# Patient Record
Sex: Male | Born: 1935 | Hispanic: No | Smoking: Former smoker
Health system: Southern US, Community
[De-identification: ages and names within clinical notes are randomized; demographics above are authoritative.]

## PROBLEM LIST (undated history)

## (undated) DIAGNOSIS — J849 Interstitial pulmonary disease, unspecified: Secondary | ICD-10-CM

## (undated) DIAGNOSIS — E079 Disorder of thyroid, unspecified: Secondary | ICD-10-CM

## (undated) DIAGNOSIS — J90 Pleural effusion, not elsewhere classified: Secondary | ICD-10-CM

## (undated) DIAGNOSIS — F0390 Unspecified dementia without behavioral disturbance: Secondary | ICD-10-CM

## (undated) DIAGNOSIS — I5021 Acute systolic (congestive) heart failure: Secondary | ICD-10-CM

## (undated) DIAGNOSIS — G20A1 Parkinson's disease without dyskinesia, without mention of fluctuations: Secondary | ICD-10-CM

## (undated) DIAGNOSIS — R0902 Hypoxemia: Secondary | ICD-10-CM

## (undated) DIAGNOSIS — E039 Hypothyroidism, unspecified: Secondary | ICD-10-CM

## (undated) DIAGNOSIS — J189 Pneumonia, unspecified organism: Secondary | ICD-10-CM

## (undated) HISTORY — DX: Disorder of thyroid, unspecified: E07.9

## (undated) HISTORY — DX: Hypoxemia: R09.02

## (undated) HISTORY — DX: Unspecified dementia, unspecified severity, without behavioral disturbance, psychotic disturbance, mood disturbance, and anxiety: F03.90

## (undated) HISTORY — DX: Pleural effusion, not elsewhere classified: J90

## (undated) HISTORY — DX: Interstitial pulmonary disease, unspecified: J84.9

## (undated) HISTORY — DX: Parkinson's disease without dyskinesia, without mention of fluctuations: G20.A1

## (undated) HISTORY — DX: Pneumonia, unspecified organism: J18.9

## (undated) HISTORY — DX: Acute systolic (congestive) heart failure: I50.21

## (undated) HISTORY — DX: Hypothyroidism, unspecified: E03.9

---

## 2015-03-27 DIAGNOSIS — L110 Acquired keratosis follicularis: Secondary | ICD-10-CM | POA: Diagnosis not present

## 2015-03-27 DIAGNOSIS — I739 Peripheral vascular disease, unspecified: Secondary | ICD-10-CM | POA: Diagnosis not present

## 2015-03-27 DIAGNOSIS — L609 Nail disorder, unspecified: Secondary | ICD-10-CM | POA: Diagnosis not present

## 2015-05-07 DIAGNOSIS — E7800 Pure hypercholesterolemia, unspecified: Secondary | ICD-10-CM | POA: Diagnosis not present

## 2015-05-07 DIAGNOSIS — E039 Hypothyroidism, unspecified: Secondary | ICD-10-CM | POA: Diagnosis not present

## 2015-05-07 DIAGNOSIS — F0390 Unspecified dementia without behavioral disturbance: Secondary | ICD-10-CM | POA: Diagnosis not present

## 2015-05-14 DIAGNOSIS — R351 Nocturia: Secondary | ICD-10-CM | POA: Diagnosis not present

## 2015-05-14 DIAGNOSIS — R3912 Poor urinary stream: Secondary | ICD-10-CM | POA: Diagnosis not present

## 2015-05-14 DIAGNOSIS — R3913 Splitting of urinary stream: Secondary | ICD-10-CM | POA: Diagnosis not present

## 2015-05-14 DIAGNOSIS — R350 Frequency of micturition: Secondary | ICD-10-CM | POA: Diagnosis not present

## 2015-05-14 DIAGNOSIS — R3915 Urgency of urination: Secondary | ICD-10-CM | POA: Diagnosis not present

## 2015-05-14 DIAGNOSIS — R972 Elevated prostate specific antigen [PSA]: Secondary | ICD-10-CM | POA: Diagnosis not present

## 2015-06-19 DIAGNOSIS — I739 Peripheral vascular disease, unspecified: Secondary | ICD-10-CM | POA: Diagnosis not present

## 2015-06-19 DIAGNOSIS — M79672 Pain in left foot: Secondary | ICD-10-CM | POA: Diagnosis not present

## 2015-06-19 DIAGNOSIS — B351 Tinea unguium: Secondary | ICD-10-CM | POA: Diagnosis not present

## 2015-06-19 DIAGNOSIS — M79671 Pain in right foot: Secondary | ICD-10-CM | POA: Diagnosis not present

## 2015-07-01 DIAGNOSIS — E7800 Pure hypercholesterolemia, unspecified: Secondary | ICD-10-CM | POA: Diagnosis not present

## 2015-07-01 DIAGNOSIS — G309 Alzheimer's disease, unspecified: Secondary | ICD-10-CM | POA: Diagnosis not present

## 2015-07-19 DIAGNOSIS — G309 Alzheimer's disease, unspecified: Secondary | ICD-10-CM | POA: Diagnosis not present

## 2015-07-19 DIAGNOSIS — E7800 Pure hypercholesterolemia, unspecified: Secondary | ICD-10-CM | POA: Diagnosis not present

## 2015-08-28 DIAGNOSIS — I739 Peripheral vascular disease, unspecified: Secondary | ICD-10-CM | POA: Diagnosis not present

## 2015-08-28 DIAGNOSIS — L110 Acquired keratosis follicularis: Secondary | ICD-10-CM | POA: Diagnosis not present

## 2015-08-28 DIAGNOSIS — B351 Tinea unguium: Secondary | ICD-10-CM | POA: Diagnosis not present

## 2015-09-27 DIAGNOSIS — H2513 Age-related nuclear cataract, bilateral: Secondary | ICD-10-CM | POA: Diagnosis not present

## 2015-11-12 DIAGNOSIS — H43811 Vitreous degeneration, right eye: Secondary | ICD-10-CM | POA: Diagnosis not present

## 2015-11-27 DIAGNOSIS — M79672 Pain in left foot: Secondary | ICD-10-CM | POA: Diagnosis not present

## 2015-11-27 DIAGNOSIS — I739 Peripheral vascular disease, unspecified: Secondary | ICD-10-CM | POA: Diagnosis not present

## 2015-11-27 DIAGNOSIS — M79671 Pain in right foot: Secondary | ICD-10-CM | POA: Diagnosis not present

## 2015-11-27 DIAGNOSIS — B351 Tinea unguium: Secondary | ICD-10-CM | POA: Diagnosis not present

## 2015-12-18 DIAGNOSIS — E7800 Pure hypercholesterolemia, unspecified: Secondary | ICD-10-CM | POA: Diagnosis not present

## 2015-12-18 DIAGNOSIS — G309 Alzheimer's disease, unspecified: Secondary | ICD-10-CM | POA: Diagnosis not present

## 2016-01-01 DIAGNOSIS — Z23 Encounter for immunization: Secondary | ICD-10-CM | POA: Diagnosis not present

## 2016-02-05 DIAGNOSIS — B351 Tinea unguium: Secondary | ICD-10-CM | POA: Diagnosis not present

## 2016-02-05 DIAGNOSIS — L110 Acquired keratosis follicularis: Secondary | ICD-10-CM | POA: Diagnosis not present

## 2016-02-05 DIAGNOSIS — I739 Peripheral vascular disease, unspecified: Secondary | ICD-10-CM | POA: Diagnosis not present

## 2016-02-12 DIAGNOSIS — H43811 Vitreous degeneration, right eye: Secondary | ICD-10-CM | POA: Diagnosis not present

## 2016-02-18 DIAGNOSIS — Z6820 Body mass index (BMI) 20.0-20.9, adult: Secondary | ICD-10-CM | POA: Diagnosis not present

## 2016-02-18 DIAGNOSIS — E7800 Pure hypercholesterolemia, unspecified: Secondary | ICD-10-CM | POA: Diagnosis not present

## 2016-02-18 DIAGNOSIS — Z1211 Encounter for screening for malignant neoplasm of colon: Secondary | ICD-10-CM | POA: Diagnosis not present

## 2016-02-18 DIAGNOSIS — Z7189 Other specified counseling: Secondary | ICD-10-CM | POA: Diagnosis not present

## 2016-02-18 DIAGNOSIS — Z0000 Encounter for general adult medical examination without abnormal findings: Secondary | ICD-10-CM | POA: Diagnosis not present

## 2016-02-18 DIAGNOSIS — E039 Hypothyroidism, unspecified: Secondary | ICD-10-CM | POA: Diagnosis not present

## 2016-02-18 DIAGNOSIS — Z125 Encounter for screening for malignant neoplasm of prostate: Secondary | ICD-10-CM | POA: Diagnosis not present

## 2016-02-18 DIAGNOSIS — Z299 Encounter for prophylactic measures, unspecified: Secondary | ICD-10-CM | POA: Diagnosis not present

## 2016-02-18 DIAGNOSIS — Z79899 Other long term (current) drug therapy: Secondary | ICD-10-CM | POA: Diagnosis not present

## 2016-02-18 DIAGNOSIS — Z1389 Encounter for screening for other disorder: Secondary | ICD-10-CM | POA: Diagnosis not present

## 2016-04-08 DIAGNOSIS — E7800 Pure hypercholesterolemia, unspecified: Secondary | ICD-10-CM | POA: Diagnosis not present

## 2016-04-08 DIAGNOSIS — G309 Alzheimer's disease, unspecified: Secondary | ICD-10-CM | POA: Diagnosis not present

## 2016-04-22 DIAGNOSIS — I739 Peripheral vascular disease, unspecified: Secondary | ICD-10-CM | POA: Diagnosis not present

## 2016-04-22 DIAGNOSIS — B351 Tinea unguium: Secondary | ICD-10-CM | POA: Diagnosis not present

## 2016-04-22 DIAGNOSIS — L110 Acquired keratosis follicularis: Secondary | ICD-10-CM | POA: Diagnosis not present

## 2016-05-02 DIAGNOSIS — R4182 Altered mental status, unspecified: Secondary | ICD-10-CM | POA: Diagnosis not present

## 2016-05-02 DIAGNOSIS — R5381 Other malaise: Secondary | ICD-10-CM | POA: Diagnosis not present

## 2016-05-02 DIAGNOSIS — R404 Transient alteration of awareness: Secondary | ICD-10-CM | POA: Diagnosis not present

## 2016-05-02 DIAGNOSIS — R402411 Glasgow coma scale score 13-15, in the field [EMT or ambulance]: Secondary | ICD-10-CM | POA: Diagnosis not present

## 2016-05-19 DIAGNOSIS — R350 Frequency of micturition: Secondary | ICD-10-CM | POA: Diagnosis not present

## 2016-05-19 DIAGNOSIS — R3915 Urgency of urination: Secondary | ICD-10-CM | POA: Diagnosis not present

## 2016-05-19 DIAGNOSIS — R972 Elevated prostate specific antigen [PSA]: Secondary | ICD-10-CM | POA: Diagnosis not present

## 2016-05-19 DIAGNOSIS — R351 Nocturia: Secondary | ICD-10-CM | POA: Diagnosis not present

## 2016-05-19 DIAGNOSIS — N401 Enlarged prostate with lower urinary tract symptoms: Secondary | ICD-10-CM | POA: Diagnosis not present

## 2016-05-22 DIAGNOSIS — N401 Enlarged prostate with lower urinary tract symptoms: Secondary | ICD-10-CM | POA: Diagnosis not present

## 2016-07-01 DIAGNOSIS — L110 Acquired keratosis follicularis: Secondary | ICD-10-CM | POA: Diagnosis not present

## 2016-07-01 DIAGNOSIS — I739 Peripheral vascular disease, unspecified: Secondary | ICD-10-CM | POA: Diagnosis not present

## 2016-07-01 DIAGNOSIS — B351 Tinea unguium: Secondary | ICD-10-CM | POA: Diagnosis not present

## 2016-07-28 DIAGNOSIS — G309 Alzheimer's disease, unspecified: Secondary | ICD-10-CM | POA: Diagnosis not present

## 2016-07-28 DIAGNOSIS — E7800 Pure hypercholesterolemia, unspecified: Secondary | ICD-10-CM | POA: Diagnosis not present

## 2016-08-13 DIAGNOSIS — H2513 Age-related nuclear cataract, bilateral: Secondary | ICD-10-CM | POA: Diagnosis not present

## 2016-08-13 DIAGNOSIS — H5440 Blindness, one eye, unspecified eye: Secondary | ICD-10-CM | POA: Diagnosis not present

## 2016-08-13 DIAGNOSIS — H43811 Vitreous degeneration, right eye: Secondary | ICD-10-CM | POA: Diagnosis not present

## 2016-10-09 DIAGNOSIS — G309 Alzheimer's disease, unspecified: Secondary | ICD-10-CM | POA: Diagnosis not present

## 2016-10-09 DIAGNOSIS — E7800 Pure hypercholesterolemia, unspecified: Secondary | ICD-10-CM | POA: Diagnosis not present

## 2016-10-13 DIAGNOSIS — H43811 Vitreous degeneration, right eye: Secondary | ICD-10-CM | POA: Diagnosis not present

## 2016-10-13 DIAGNOSIS — H5440 Blindness, one eye, unspecified eye: Secondary | ICD-10-CM | POA: Diagnosis not present

## 2016-10-13 DIAGNOSIS — H31101 Choroidal degeneration, unspecified, right eye: Secondary | ICD-10-CM | POA: Diagnosis not present

## 2016-10-13 DIAGNOSIS — H2513 Age-related nuclear cataract, bilateral: Secondary | ICD-10-CM | POA: Diagnosis not present

## 2016-10-14 DIAGNOSIS — M79671 Pain in right foot: Secondary | ICD-10-CM | POA: Diagnosis not present

## 2016-10-14 DIAGNOSIS — I739 Peripheral vascular disease, unspecified: Secondary | ICD-10-CM | POA: Diagnosis not present

## 2016-10-14 DIAGNOSIS — B351 Tinea unguium: Secondary | ICD-10-CM | POA: Diagnosis not present

## 2016-10-14 DIAGNOSIS — L110 Acquired keratosis follicularis: Secondary | ICD-10-CM | POA: Diagnosis not present

## 2016-11-19 DIAGNOSIS — G309 Alzheimer's disease, unspecified: Secondary | ICD-10-CM | POA: Diagnosis not present

## 2016-11-19 DIAGNOSIS — E7800 Pure hypercholesterolemia, unspecified: Secondary | ICD-10-CM | POA: Diagnosis not present

## 2016-12-14 DIAGNOSIS — E7800 Pure hypercholesterolemia, unspecified: Secondary | ICD-10-CM | POA: Diagnosis not present

## 2016-12-14 DIAGNOSIS — G309 Alzheimer's disease, unspecified: Secondary | ICD-10-CM | POA: Diagnosis not present

## 2017-01-06 DIAGNOSIS — I739 Peripheral vascular disease, unspecified: Secondary | ICD-10-CM | POA: Diagnosis not present

## 2017-01-06 DIAGNOSIS — B351 Tinea unguium: Secondary | ICD-10-CM | POA: Diagnosis not present

## 2017-01-06 DIAGNOSIS — M79671 Pain in right foot: Secondary | ICD-10-CM | POA: Diagnosis not present

## 2017-01-06 DIAGNOSIS — L110 Acquired keratosis follicularis: Secondary | ICD-10-CM | POA: Diagnosis not present

## 2017-01-08 DIAGNOSIS — G309 Alzheimer's disease, unspecified: Secondary | ICD-10-CM | POA: Diagnosis not present

## 2017-01-08 DIAGNOSIS — E7800 Pure hypercholesterolemia, unspecified: Secondary | ICD-10-CM | POA: Diagnosis not present

## 2017-01-15 DIAGNOSIS — Z23 Encounter for immunization: Secondary | ICD-10-CM | POA: Diagnosis not present

## 2017-02-16 DIAGNOSIS — G309 Alzheimer's disease, unspecified: Secondary | ICD-10-CM | POA: Diagnosis not present

## 2017-02-16 DIAGNOSIS — E7800 Pure hypercholesterolemia, unspecified: Secondary | ICD-10-CM | POA: Diagnosis not present

## 2017-02-23 DIAGNOSIS — Z1211 Encounter for screening for malignant neoplasm of colon: Secondary | ICD-10-CM | POA: Diagnosis not present

## 2017-02-23 DIAGNOSIS — Z125 Encounter for screening for malignant neoplasm of prostate: Secondary | ICD-10-CM | POA: Diagnosis not present

## 2017-02-23 DIAGNOSIS — Z6820 Body mass index (BMI) 20.0-20.9, adult: Secondary | ICD-10-CM | POA: Diagnosis not present

## 2017-02-23 DIAGNOSIS — E7800 Pure hypercholesterolemia, unspecified: Secondary | ICD-10-CM | POA: Diagnosis not present

## 2017-02-23 DIAGNOSIS — Z299 Encounter for prophylactic measures, unspecified: Secondary | ICD-10-CM | POA: Diagnosis not present

## 2017-02-23 DIAGNOSIS — Z1331 Encounter for screening for depression: Secondary | ICD-10-CM | POA: Diagnosis not present

## 2017-02-23 DIAGNOSIS — Z79899 Other long term (current) drug therapy: Secondary | ICD-10-CM | POA: Diagnosis not present

## 2017-02-23 DIAGNOSIS — Z7189 Other specified counseling: Secondary | ICD-10-CM | POA: Diagnosis not present

## 2017-02-23 DIAGNOSIS — Z1339 Encounter for screening examination for other mental health and behavioral disorders: Secondary | ICD-10-CM | POA: Diagnosis not present

## 2017-02-23 DIAGNOSIS — Z87891 Personal history of nicotine dependence: Secondary | ICD-10-CM | POA: Diagnosis not present

## 2017-02-23 DIAGNOSIS — Z0000 Encounter for general adult medical examination without abnormal findings: Secondary | ICD-10-CM | POA: Diagnosis not present

## 2017-02-23 DIAGNOSIS — R5383 Other fatigue: Secondary | ICD-10-CM | POA: Diagnosis not present

## 2017-02-23 DIAGNOSIS — E039 Hypothyroidism, unspecified: Secondary | ICD-10-CM | POA: Diagnosis not present

## 2017-03-24 DIAGNOSIS — M79671 Pain in right foot: Secondary | ICD-10-CM | POA: Diagnosis not present

## 2017-03-24 DIAGNOSIS — I739 Peripheral vascular disease, unspecified: Secondary | ICD-10-CM | POA: Diagnosis not present

## 2017-03-24 DIAGNOSIS — B351 Tinea unguium: Secondary | ICD-10-CM | POA: Diagnosis not present

## 2017-03-24 DIAGNOSIS — L110 Acquired keratosis follicularis: Secondary | ICD-10-CM | POA: Diagnosis not present

## 2017-05-25 DIAGNOSIS — R351 Nocturia: Secondary | ICD-10-CM | POA: Diagnosis not present

## 2017-05-25 DIAGNOSIS — R972 Elevated prostate specific antigen [PSA]: Secondary | ICD-10-CM | POA: Diagnosis not present

## 2017-05-25 DIAGNOSIS — N401 Enlarged prostate with lower urinary tract symptoms: Secondary | ICD-10-CM | POA: Diagnosis not present

## 2017-05-25 DIAGNOSIS — R350 Frequency of micturition: Secondary | ICD-10-CM | POA: Diagnosis not present

## 2017-05-25 DIAGNOSIS — N281 Cyst of kidney, acquired: Secondary | ICD-10-CM | POA: Diagnosis not present

## 2017-05-28 DIAGNOSIS — G309 Alzheimer's disease, unspecified: Secondary | ICD-10-CM | POA: Diagnosis not present

## 2017-05-28 DIAGNOSIS — E7800 Pure hypercholesterolemia, unspecified: Secondary | ICD-10-CM | POA: Diagnosis not present

## 2017-06-09 DIAGNOSIS — L110 Acquired keratosis follicularis: Secondary | ICD-10-CM | POA: Diagnosis not present

## 2017-06-09 DIAGNOSIS — B351 Tinea unguium: Secondary | ICD-10-CM | POA: Diagnosis not present

## 2017-06-09 DIAGNOSIS — I739 Peripheral vascular disease, unspecified: Secondary | ICD-10-CM | POA: Diagnosis not present

## 2017-06-09 DIAGNOSIS — M79671 Pain in right foot: Secondary | ICD-10-CM | POA: Diagnosis not present

## 2017-06-16 DIAGNOSIS — E7800 Pure hypercholesterolemia, unspecified: Secondary | ICD-10-CM | POA: Diagnosis not present

## 2017-06-16 DIAGNOSIS — G309 Alzheimer's disease, unspecified: Secondary | ICD-10-CM | POA: Diagnosis not present

## 2017-07-20 DIAGNOSIS — G309 Alzheimer's disease, unspecified: Secondary | ICD-10-CM | POA: Diagnosis not present

## 2017-07-20 DIAGNOSIS — E7800 Pure hypercholesterolemia, unspecified: Secondary | ICD-10-CM | POA: Diagnosis not present

## 2017-08-16 DIAGNOSIS — E7800 Pure hypercholesterolemia, unspecified: Secondary | ICD-10-CM | POA: Diagnosis not present

## 2017-08-16 DIAGNOSIS — G309 Alzheimer's disease, unspecified: Secondary | ICD-10-CM | POA: Diagnosis not present

## 2017-09-15 DIAGNOSIS — B351 Tinea unguium: Secondary | ICD-10-CM | POA: Diagnosis not present

## 2017-09-15 DIAGNOSIS — L110 Acquired keratosis follicularis: Secondary | ICD-10-CM | POA: Diagnosis not present

## 2017-09-15 DIAGNOSIS — I739 Peripheral vascular disease, unspecified: Secondary | ICD-10-CM | POA: Diagnosis not present

## 2017-09-15 DIAGNOSIS — M79671 Pain in right foot: Secondary | ICD-10-CM | POA: Diagnosis not present

## 2017-10-06 DIAGNOSIS — G309 Alzheimer's disease, unspecified: Secondary | ICD-10-CM | POA: Diagnosis not present

## 2017-10-06 DIAGNOSIS — E7800 Pure hypercholesterolemia, unspecified: Secondary | ICD-10-CM | POA: Diagnosis not present

## 2017-11-02 DIAGNOSIS — E7800 Pure hypercholesterolemia, unspecified: Secondary | ICD-10-CM | POA: Diagnosis not present

## 2017-11-02 DIAGNOSIS — G309 Alzheimer's disease, unspecified: Secondary | ICD-10-CM | POA: Diagnosis not present

## 2017-11-22 DIAGNOSIS — E7800 Pure hypercholesterolemia, unspecified: Secondary | ICD-10-CM | POA: Diagnosis not present

## 2017-11-22 DIAGNOSIS — G309 Alzheimer's disease, unspecified: Secondary | ICD-10-CM | POA: Diagnosis not present

## 2017-12-08 DIAGNOSIS — M79671 Pain in right foot: Secondary | ICD-10-CM | POA: Diagnosis not present

## 2017-12-08 DIAGNOSIS — I739 Peripheral vascular disease, unspecified: Secondary | ICD-10-CM | POA: Diagnosis not present

## 2017-12-08 DIAGNOSIS — B351 Tinea unguium: Secondary | ICD-10-CM | POA: Diagnosis not present

## 2017-12-08 DIAGNOSIS — L110 Acquired keratosis follicularis: Secondary | ICD-10-CM | POA: Diagnosis not present

## 2018-02-01 DIAGNOSIS — E7800 Pure hypercholesterolemia, unspecified: Secondary | ICD-10-CM | POA: Diagnosis not present

## 2018-02-01 DIAGNOSIS — G309 Alzheimer's disease, unspecified: Secondary | ICD-10-CM | POA: Diagnosis not present

## 2018-02-16 DIAGNOSIS — M79672 Pain in left foot: Secondary | ICD-10-CM | POA: Diagnosis not present

## 2018-02-16 DIAGNOSIS — L110 Acquired keratosis follicularis: Secondary | ICD-10-CM | POA: Diagnosis not present

## 2018-02-16 DIAGNOSIS — B351 Tinea unguium: Secondary | ICD-10-CM | POA: Diagnosis not present

## 2018-02-16 DIAGNOSIS — I739 Peripheral vascular disease, unspecified: Secondary | ICD-10-CM | POA: Diagnosis not present

## 2018-02-16 DIAGNOSIS — M79671 Pain in right foot: Secondary | ICD-10-CM | POA: Diagnosis not present

## 2018-03-04 DIAGNOSIS — R5383 Other fatigue: Secondary | ICD-10-CM | POA: Diagnosis not present

## 2018-03-04 DIAGNOSIS — Z125 Encounter for screening for malignant neoplasm of prostate: Secondary | ICD-10-CM | POA: Diagnosis not present

## 2018-03-04 DIAGNOSIS — Z7189 Other specified counseling: Secondary | ICD-10-CM | POA: Diagnosis not present

## 2018-03-04 DIAGNOSIS — Z1331 Encounter for screening for depression: Secondary | ICD-10-CM | POA: Diagnosis not present

## 2018-03-04 DIAGNOSIS — Z1211 Encounter for screening for malignant neoplasm of colon: Secondary | ICD-10-CM | POA: Diagnosis not present

## 2018-03-04 DIAGNOSIS — E039 Hypothyroidism, unspecified: Secondary | ICD-10-CM | POA: Diagnosis not present

## 2018-03-04 DIAGNOSIS — Z79899 Other long term (current) drug therapy: Secondary | ICD-10-CM | POA: Diagnosis not present

## 2018-03-04 DIAGNOSIS — Z299 Encounter for prophylactic measures, unspecified: Secondary | ICD-10-CM | POA: Diagnosis not present

## 2018-03-04 DIAGNOSIS — E7800 Pure hypercholesterolemia, unspecified: Secondary | ICD-10-CM | POA: Diagnosis not present

## 2018-03-04 DIAGNOSIS — Z0000 Encounter for general adult medical examination without abnormal findings: Secondary | ICD-10-CM | POA: Diagnosis not present

## 2018-03-04 DIAGNOSIS — Z6824 Body mass index (BMI) 24.0-24.9, adult: Secondary | ICD-10-CM | POA: Diagnosis not present

## 2018-03-04 DIAGNOSIS — Z1339 Encounter for screening examination for other mental health and behavioral disorders: Secondary | ICD-10-CM | POA: Diagnosis not present

## 2018-03-04 DIAGNOSIS — G20 Parkinson's disease: Secondary | ICD-10-CM | POA: Diagnosis not present

## 2018-04-07 DIAGNOSIS — H2513 Age-related nuclear cataract, bilateral: Secondary | ICD-10-CM | POA: Diagnosis not present

## 2018-04-07 DIAGNOSIS — Z135 Encounter for screening for eye and ear disorders: Secondary | ICD-10-CM | POA: Diagnosis not present

## 2018-04-07 DIAGNOSIS — H5000 Unspecified esotropia: Secondary | ICD-10-CM | POA: Diagnosis not present

## 2018-04-07 DIAGNOSIS — H5203 Hypermetropia, bilateral: Secondary | ICD-10-CM | POA: Diagnosis not present

## 2018-04-08 DIAGNOSIS — R413 Other amnesia: Secondary | ICD-10-CM | POA: Diagnosis not present

## 2018-04-08 DIAGNOSIS — R972 Elevated prostate specific antigen [PSA]: Secondary | ICD-10-CM | POA: Diagnosis not present

## 2018-04-08 DIAGNOSIS — F0390 Unspecified dementia without behavioral disturbance: Secondary | ICD-10-CM | POA: Diagnosis not present

## 2018-04-08 DIAGNOSIS — Z299 Encounter for prophylactic measures, unspecified: Secondary | ICD-10-CM | POA: Diagnosis not present

## 2018-04-08 DIAGNOSIS — G20 Parkinson's disease: Secondary | ICD-10-CM | POA: Diagnosis not present

## 2018-04-08 DIAGNOSIS — Z87891 Personal history of nicotine dependence: Secondary | ICD-10-CM | POA: Diagnosis not present

## 2018-04-08 DIAGNOSIS — Z6820 Body mass index (BMI) 20.0-20.9, adult: Secondary | ICD-10-CM | POA: Diagnosis not present

## 2018-04-12 DIAGNOSIS — G309 Alzheimer's disease, unspecified: Secondary | ICD-10-CM | POA: Diagnosis not present

## 2018-04-12 DIAGNOSIS — E7800 Pure hypercholesterolemia, unspecified: Secondary | ICD-10-CM | POA: Diagnosis not present

## 2018-05-02 DIAGNOSIS — H2511 Age-related nuclear cataract, right eye: Secondary | ICD-10-CM | POA: Diagnosis not present

## 2018-05-02 DIAGNOSIS — H2513 Age-related nuclear cataract, bilateral: Secondary | ICD-10-CM | POA: Diagnosis not present

## 2018-05-04 DIAGNOSIS — M79672 Pain in left foot: Secondary | ICD-10-CM | POA: Diagnosis not present

## 2018-05-04 DIAGNOSIS — L110 Acquired keratosis follicularis: Secondary | ICD-10-CM | POA: Diagnosis not present

## 2018-05-04 DIAGNOSIS — B351 Tinea unguium: Secondary | ICD-10-CM | POA: Diagnosis not present

## 2018-05-04 DIAGNOSIS — I739 Peripheral vascular disease, unspecified: Secondary | ICD-10-CM | POA: Diagnosis not present

## 2018-05-04 DIAGNOSIS — M79671 Pain in right foot: Secondary | ICD-10-CM | POA: Diagnosis not present

## 2018-05-24 DIAGNOSIS — H2511 Age-related nuclear cataract, right eye: Secondary | ICD-10-CM | POA: Diagnosis not present

## 2018-06-15 DIAGNOSIS — E7800 Pure hypercholesterolemia, unspecified: Secondary | ICD-10-CM | POA: Diagnosis not present

## 2018-06-15 DIAGNOSIS — G309 Alzheimer's disease, unspecified: Secondary | ICD-10-CM | POA: Diagnosis not present

## 2018-07-07 DIAGNOSIS — R972 Elevated prostate specific antigen [PSA]: Secondary | ICD-10-CM | POA: Diagnosis not present

## 2018-07-07 DIAGNOSIS — N401 Enlarged prostate with lower urinary tract symptoms: Secondary | ICD-10-CM | POA: Diagnosis not present

## 2018-07-07 DIAGNOSIS — R351 Nocturia: Secondary | ICD-10-CM | POA: Diagnosis not present

## 2018-07-07 DIAGNOSIS — R350 Frequency of micturition: Secondary | ICD-10-CM | POA: Diagnosis not present

## 2018-07-11 DIAGNOSIS — H2512 Age-related nuclear cataract, left eye: Secondary | ICD-10-CM | POA: Diagnosis not present

## 2018-07-11 DIAGNOSIS — H2589 Other age-related cataract: Secondary | ICD-10-CM | POA: Diagnosis not present

## 2018-07-14 DIAGNOSIS — E039 Hypothyroidism, unspecified: Secondary | ICD-10-CM | POA: Diagnosis not present

## 2018-07-14 DIAGNOSIS — Z299 Encounter for prophylactic measures, unspecified: Secondary | ICD-10-CM | POA: Diagnosis not present

## 2018-07-14 DIAGNOSIS — G20 Parkinson's disease: Secondary | ICD-10-CM | POA: Diagnosis not present

## 2018-07-14 DIAGNOSIS — F0390 Unspecified dementia without behavioral disturbance: Secondary | ICD-10-CM | POA: Diagnosis not present

## 2018-07-14 DIAGNOSIS — Z6820 Body mass index (BMI) 20.0-20.9, adult: Secondary | ICD-10-CM | POA: Diagnosis not present

## 2018-07-27 DIAGNOSIS — B351 Tinea unguium: Secondary | ICD-10-CM | POA: Diagnosis not present

## 2018-07-27 DIAGNOSIS — M79672 Pain in left foot: Secondary | ICD-10-CM | POA: Diagnosis not present

## 2018-07-27 DIAGNOSIS — I739 Peripheral vascular disease, unspecified: Secondary | ICD-10-CM | POA: Diagnosis not present

## 2018-07-27 DIAGNOSIS — M79671 Pain in right foot: Secondary | ICD-10-CM | POA: Diagnosis not present

## 2018-07-27 DIAGNOSIS — L110 Acquired keratosis follicularis: Secondary | ICD-10-CM | POA: Diagnosis not present

## 2018-08-02 DIAGNOSIS — E7800 Pure hypercholesterolemia, unspecified: Secondary | ICD-10-CM | POA: Diagnosis not present

## 2018-08-02 DIAGNOSIS — G309 Alzheimer's disease, unspecified: Secondary | ICD-10-CM | POA: Diagnosis not present

## 2018-08-30 DIAGNOSIS — E7800 Pure hypercholesterolemia, unspecified: Secondary | ICD-10-CM | POA: Diagnosis not present

## 2018-08-30 DIAGNOSIS — G309 Alzheimer's disease, unspecified: Secondary | ICD-10-CM | POA: Diagnosis not present

## 2018-10-19 DIAGNOSIS — I739 Peripheral vascular disease, unspecified: Secondary | ICD-10-CM | POA: Diagnosis not present

## 2018-10-19 DIAGNOSIS — M79672 Pain in left foot: Secondary | ICD-10-CM | POA: Diagnosis not present

## 2018-10-19 DIAGNOSIS — M79671 Pain in right foot: Secondary | ICD-10-CM | POA: Diagnosis not present

## 2018-10-19 DIAGNOSIS — B351 Tinea unguium: Secondary | ICD-10-CM | POA: Diagnosis not present

## 2018-10-19 DIAGNOSIS — L110 Acquired keratosis follicularis: Secondary | ICD-10-CM | POA: Diagnosis not present

## 2018-11-15 DIAGNOSIS — F0390 Unspecified dementia without behavioral disturbance: Secondary | ICD-10-CM | POA: Diagnosis not present

## 2018-11-15 DIAGNOSIS — G20 Parkinson's disease: Secondary | ICD-10-CM | POA: Diagnosis not present

## 2018-11-15 DIAGNOSIS — Z299 Encounter for prophylactic measures, unspecified: Secondary | ICD-10-CM | POA: Diagnosis not present

## 2018-11-15 DIAGNOSIS — Z6820 Body mass index (BMI) 20.0-20.9, adult: Secondary | ICD-10-CM | POA: Diagnosis not present

## 2018-11-15 DIAGNOSIS — E039 Hypothyroidism, unspecified: Secondary | ICD-10-CM | POA: Diagnosis not present

## 2018-11-23 DIAGNOSIS — G309 Alzheimer's disease, unspecified: Secondary | ICD-10-CM | POA: Diagnosis not present

## 2018-11-23 DIAGNOSIS — E7800 Pure hypercholesterolemia, unspecified: Secondary | ICD-10-CM | POA: Diagnosis not present

## 2018-12-23 DIAGNOSIS — Z299 Encounter for prophylactic measures, unspecified: Secondary | ICD-10-CM | POA: Diagnosis not present

## 2018-12-23 DIAGNOSIS — Z23 Encounter for immunization: Secondary | ICD-10-CM | POA: Diagnosis not present

## 2018-12-28 DIAGNOSIS — M79671 Pain in right foot: Secondary | ICD-10-CM | POA: Diagnosis not present

## 2018-12-28 DIAGNOSIS — L110 Acquired keratosis follicularis: Secondary | ICD-10-CM | POA: Diagnosis not present

## 2018-12-28 DIAGNOSIS — I739 Peripheral vascular disease, unspecified: Secondary | ICD-10-CM | POA: Diagnosis not present

## 2018-12-28 DIAGNOSIS — M79672 Pain in left foot: Secondary | ICD-10-CM | POA: Diagnosis not present

## 2018-12-28 DIAGNOSIS — B351 Tinea unguium: Secondary | ICD-10-CM | POA: Diagnosis not present

## 2019-02-16 DIAGNOSIS — Z79899 Other long term (current) drug therapy: Secondary | ICD-10-CM | POA: Diagnosis not present

## 2019-02-16 DIAGNOSIS — E7800 Pure hypercholesterolemia, unspecified: Secondary | ICD-10-CM | POA: Diagnosis not present

## 2019-02-16 DIAGNOSIS — G20 Parkinson's disease: Secondary | ICD-10-CM | POA: Diagnosis not present

## 2019-02-16 DIAGNOSIS — Z1339 Encounter for screening examination for other mental health and behavioral disorders: Secondary | ICD-10-CM | POA: Diagnosis not present

## 2019-02-16 DIAGNOSIS — Z125 Encounter for screening for malignant neoplasm of prostate: Secondary | ICD-10-CM | POA: Diagnosis not present

## 2019-02-16 DIAGNOSIS — Z6820 Body mass index (BMI) 20.0-20.9, adult: Secondary | ICD-10-CM | POA: Diagnosis not present

## 2019-02-16 DIAGNOSIS — R5383 Other fatigue: Secondary | ICD-10-CM | POA: Diagnosis not present

## 2019-02-16 DIAGNOSIS — E039 Hypothyroidism, unspecified: Secondary | ICD-10-CM | POA: Diagnosis not present

## 2019-02-16 DIAGNOSIS — Z1331 Encounter for screening for depression: Secondary | ICD-10-CM | POA: Diagnosis not present

## 2019-02-16 DIAGNOSIS — F0390 Unspecified dementia without behavioral disturbance: Secondary | ICD-10-CM | POA: Diagnosis not present

## 2019-02-16 DIAGNOSIS — Z7189 Other specified counseling: Secondary | ICD-10-CM | POA: Diagnosis not present

## 2019-02-16 DIAGNOSIS — Z1211 Encounter for screening for malignant neoplasm of colon: Secondary | ICD-10-CM | POA: Diagnosis not present

## 2019-02-16 DIAGNOSIS — Z299 Encounter for prophylactic measures, unspecified: Secondary | ICD-10-CM | POA: Diagnosis not present

## 2019-02-16 DIAGNOSIS — Z0000 Encounter for general adult medical examination without abnormal findings: Secondary | ICD-10-CM | POA: Diagnosis not present

## 2019-02-27 DIAGNOSIS — Z03818 Encounter for observation for suspected exposure to other biological agents ruled out: Secondary | ICD-10-CM | POA: Diagnosis not present

## 2019-03-16 DIAGNOSIS — G309 Alzheimer's disease, unspecified: Secondary | ICD-10-CM | POA: Diagnosis not present

## 2019-03-16 DIAGNOSIS — E7800 Pure hypercholesterolemia, unspecified: Secondary | ICD-10-CM | POA: Diagnosis not present

## 2019-03-21 DIAGNOSIS — Z6820 Body mass index (BMI) 20.0-20.9, adult: Secondary | ICD-10-CM | POA: Diagnosis not present

## 2019-03-21 DIAGNOSIS — R32 Unspecified urinary incontinence: Secondary | ICD-10-CM | POA: Diagnosis not present

## 2019-03-21 DIAGNOSIS — G20 Parkinson's disease: Secondary | ICD-10-CM | POA: Diagnosis not present

## 2019-03-21 DIAGNOSIS — U071 COVID-19: Secondary | ICD-10-CM | POA: Diagnosis not present

## 2019-03-21 DIAGNOSIS — Z299 Encounter for prophylactic measures, unspecified: Secondary | ICD-10-CM | POA: Diagnosis not present

## 2019-03-21 DIAGNOSIS — F0390 Unspecified dementia without behavioral disturbance: Secondary | ICD-10-CM | POA: Diagnosis not present

## 2019-03-22 DIAGNOSIS — E7800 Pure hypercholesterolemia, unspecified: Secondary | ICD-10-CM | POA: Diagnosis not present

## 2019-03-22 DIAGNOSIS — K5900 Constipation, unspecified: Secondary | ICD-10-CM | POA: Diagnosis not present

## 2019-03-22 DIAGNOSIS — R32 Unspecified urinary incontinence: Secondary | ICD-10-CM | POA: Diagnosis not present

## 2019-03-22 DIAGNOSIS — E039 Hypothyroidism, unspecified: Secondary | ICD-10-CM | POA: Diagnosis not present

## 2019-03-22 DIAGNOSIS — G20 Parkinson's disease: Secondary | ICD-10-CM | POA: Diagnosis not present

## 2019-03-22 DIAGNOSIS — U071 COVID-19: Secondary | ICD-10-CM | POA: Diagnosis not present

## 2019-03-22 DIAGNOSIS — F0280 Dementia in other diseases classified elsewhere without behavioral disturbance: Secondary | ICD-10-CM | POA: Diagnosis not present

## 2019-03-22 DIAGNOSIS — Z87891 Personal history of nicotine dependence: Secondary | ICD-10-CM | POA: Diagnosis not present

## 2019-03-22 DIAGNOSIS — R5383 Other fatigue: Secondary | ICD-10-CM | POA: Diagnosis not present

## 2019-03-24 DIAGNOSIS — R5383 Other fatigue: Secondary | ICD-10-CM | POA: Diagnosis not present

## 2019-03-24 DIAGNOSIS — R32 Unspecified urinary incontinence: Secondary | ICD-10-CM | POA: Diagnosis not present

## 2019-03-24 DIAGNOSIS — U071 COVID-19: Secondary | ICD-10-CM | POA: Diagnosis not present

## 2019-03-24 DIAGNOSIS — G20 Parkinson's disease: Secondary | ICD-10-CM | POA: Diagnosis not present

## 2019-03-24 DIAGNOSIS — K5900 Constipation, unspecified: Secondary | ICD-10-CM | POA: Diagnosis not present

## 2019-03-24 DIAGNOSIS — F0280 Dementia in other diseases classified elsewhere without behavioral disturbance: Secondary | ICD-10-CM | POA: Diagnosis not present

## 2019-03-27 DIAGNOSIS — R5383 Other fatigue: Secondary | ICD-10-CM | POA: Diagnosis not present

## 2019-03-27 DIAGNOSIS — G20 Parkinson's disease: Secondary | ICD-10-CM | POA: Diagnosis not present

## 2019-03-27 DIAGNOSIS — U071 COVID-19: Secondary | ICD-10-CM | POA: Diagnosis not present

## 2019-03-27 DIAGNOSIS — K5900 Constipation, unspecified: Secondary | ICD-10-CM | POA: Diagnosis not present

## 2019-03-27 DIAGNOSIS — R32 Unspecified urinary incontinence: Secondary | ICD-10-CM | POA: Diagnosis not present

## 2019-03-27 DIAGNOSIS — F0280 Dementia in other diseases classified elsewhere without behavioral disturbance: Secondary | ICD-10-CM | POA: Diagnosis not present

## 2019-03-28 DIAGNOSIS — R32 Unspecified urinary incontinence: Secondary | ICD-10-CM | POA: Diagnosis not present

## 2019-03-28 DIAGNOSIS — F0280 Dementia in other diseases classified elsewhere without behavioral disturbance: Secondary | ICD-10-CM | POA: Diagnosis not present

## 2019-03-28 DIAGNOSIS — R5383 Other fatigue: Secondary | ICD-10-CM | POA: Diagnosis not present

## 2019-03-28 DIAGNOSIS — K5900 Constipation, unspecified: Secondary | ICD-10-CM | POA: Diagnosis not present

## 2019-03-28 DIAGNOSIS — U071 COVID-19: Secondary | ICD-10-CM | POA: Diagnosis not present

## 2019-03-28 DIAGNOSIS — G20 Parkinson's disease: Secondary | ICD-10-CM | POA: Diagnosis not present

## 2019-03-29 DIAGNOSIS — F0280 Dementia in other diseases classified elsewhere without behavioral disturbance: Secondary | ICD-10-CM | POA: Diagnosis not present

## 2019-03-29 DIAGNOSIS — G20 Parkinson's disease: Secondary | ICD-10-CM | POA: Diagnosis not present

## 2019-03-29 DIAGNOSIS — U071 COVID-19: Secondary | ICD-10-CM | POA: Diagnosis not present

## 2019-03-29 DIAGNOSIS — R32 Unspecified urinary incontinence: Secondary | ICD-10-CM | POA: Diagnosis not present

## 2019-03-29 DIAGNOSIS — R5383 Other fatigue: Secondary | ICD-10-CM | POA: Diagnosis not present

## 2019-03-29 DIAGNOSIS — K5900 Constipation, unspecified: Secondary | ICD-10-CM | POA: Diagnosis not present

## 2019-03-30 DIAGNOSIS — R32 Unspecified urinary incontinence: Secondary | ICD-10-CM | POA: Diagnosis not present

## 2019-03-30 DIAGNOSIS — U071 COVID-19: Secondary | ICD-10-CM | POA: Diagnosis not present

## 2019-03-30 DIAGNOSIS — G20 Parkinson's disease: Secondary | ICD-10-CM | POA: Diagnosis not present

## 2019-03-30 DIAGNOSIS — K5900 Constipation, unspecified: Secondary | ICD-10-CM | POA: Diagnosis not present

## 2019-03-30 DIAGNOSIS — F0280 Dementia in other diseases classified elsewhere without behavioral disturbance: Secondary | ICD-10-CM | POA: Diagnosis not present

## 2019-03-30 DIAGNOSIS — R5383 Other fatigue: Secondary | ICD-10-CM | POA: Diagnosis not present

## 2019-03-31 DIAGNOSIS — R5383 Other fatigue: Secondary | ICD-10-CM | POA: Diagnosis not present

## 2019-03-31 DIAGNOSIS — R32 Unspecified urinary incontinence: Secondary | ICD-10-CM | POA: Diagnosis not present

## 2019-03-31 DIAGNOSIS — F0280 Dementia in other diseases classified elsewhere without behavioral disturbance: Secondary | ICD-10-CM | POA: Diagnosis not present

## 2019-03-31 DIAGNOSIS — G20 Parkinson's disease: Secondary | ICD-10-CM | POA: Diagnosis not present

## 2019-03-31 DIAGNOSIS — K5900 Constipation, unspecified: Secondary | ICD-10-CM | POA: Diagnosis not present

## 2019-03-31 DIAGNOSIS — U071 COVID-19: Secondary | ICD-10-CM | POA: Diagnosis not present

## 2019-04-03 DIAGNOSIS — R5383 Other fatigue: Secondary | ICD-10-CM | POA: Diagnosis not present

## 2019-04-03 DIAGNOSIS — F0280 Dementia in other diseases classified elsewhere without behavioral disturbance: Secondary | ICD-10-CM | POA: Diagnosis not present

## 2019-04-03 DIAGNOSIS — U071 COVID-19: Secondary | ICD-10-CM | POA: Diagnosis not present

## 2019-04-03 DIAGNOSIS — K5900 Constipation, unspecified: Secondary | ICD-10-CM | POA: Diagnosis not present

## 2019-04-03 DIAGNOSIS — R32 Unspecified urinary incontinence: Secondary | ICD-10-CM | POA: Diagnosis not present

## 2019-04-03 DIAGNOSIS — G20 Parkinson's disease: Secondary | ICD-10-CM | POA: Diagnosis not present

## 2019-04-04 DIAGNOSIS — K5900 Constipation, unspecified: Secondary | ICD-10-CM | POA: Diagnosis not present

## 2019-04-04 DIAGNOSIS — R32 Unspecified urinary incontinence: Secondary | ICD-10-CM | POA: Diagnosis not present

## 2019-04-04 DIAGNOSIS — U071 COVID-19: Secondary | ICD-10-CM | POA: Diagnosis not present

## 2019-04-04 DIAGNOSIS — G20 Parkinson's disease: Secondary | ICD-10-CM | POA: Diagnosis not present

## 2019-04-04 DIAGNOSIS — R5383 Other fatigue: Secondary | ICD-10-CM | POA: Diagnosis not present

## 2019-04-04 DIAGNOSIS — F0280 Dementia in other diseases classified elsewhere without behavioral disturbance: Secondary | ICD-10-CM | POA: Diagnosis not present

## 2019-04-05 DIAGNOSIS — K5900 Constipation, unspecified: Secondary | ICD-10-CM | POA: Diagnosis not present

## 2019-04-05 DIAGNOSIS — R5383 Other fatigue: Secondary | ICD-10-CM | POA: Diagnosis not present

## 2019-04-05 DIAGNOSIS — R32 Unspecified urinary incontinence: Secondary | ICD-10-CM | POA: Diagnosis not present

## 2019-04-05 DIAGNOSIS — G20 Parkinson's disease: Secondary | ICD-10-CM | POA: Diagnosis not present

## 2019-04-05 DIAGNOSIS — U071 COVID-19: Secondary | ICD-10-CM | POA: Diagnosis not present

## 2019-04-05 DIAGNOSIS — F0280 Dementia in other diseases classified elsewhere without behavioral disturbance: Secondary | ICD-10-CM | POA: Diagnosis not present

## 2019-04-07 DIAGNOSIS — R32 Unspecified urinary incontinence: Secondary | ICD-10-CM | POA: Diagnosis not present

## 2019-04-07 DIAGNOSIS — M25561 Pain in right knee: Secondary | ICD-10-CM | POA: Diagnosis not present

## 2019-04-07 DIAGNOSIS — G20 Parkinson's disease: Secondary | ICD-10-CM | POA: Diagnosis not present

## 2019-04-07 DIAGNOSIS — M25571 Pain in right ankle and joints of right foot: Secondary | ICD-10-CM | POA: Diagnosis not present

## 2019-04-07 DIAGNOSIS — M7731 Calcaneal spur, right foot: Secondary | ICD-10-CM | POA: Diagnosis not present

## 2019-04-07 DIAGNOSIS — S72044A Nondisplaced fracture of base of neck of right femur, initial encounter for closed fracture: Secondary | ICD-10-CM | POA: Diagnosis not present

## 2019-04-07 DIAGNOSIS — F0280 Dementia in other diseases classified elsewhere without behavioral disturbance: Secondary | ICD-10-CM | POA: Diagnosis not present

## 2019-04-07 DIAGNOSIS — U071 COVID-19: Secondary | ICD-10-CM | POA: Diagnosis not present

## 2019-04-07 DIAGNOSIS — K5900 Constipation, unspecified: Secondary | ICD-10-CM | POA: Diagnosis not present

## 2019-04-07 DIAGNOSIS — M21611 Bunion of right foot: Secondary | ICD-10-CM | POA: Diagnosis not present

## 2019-04-07 DIAGNOSIS — R5383 Other fatigue: Secondary | ICD-10-CM | POA: Diagnosis not present

## 2019-04-10 DIAGNOSIS — G20 Parkinson's disease: Secondary | ICD-10-CM | POA: Diagnosis not present

## 2019-04-10 DIAGNOSIS — U071 COVID-19: Secondary | ICD-10-CM | POA: Diagnosis not present

## 2019-04-10 DIAGNOSIS — K5900 Constipation, unspecified: Secondary | ICD-10-CM | POA: Diagnosis not present

## 2019-04-10 DIAGNOSIS — R32 Unspecified urinary incontinence: Secondary | ICD-10-CM | POA: Diagnosis not present

## 2019-04-10 DIAGNOSIS — R5383 Other fatigue: Secondary | ICD-10-CM | POA: Diagnosis not present

## 2019-04-10 DIAGNOSIS — F0280 Dementia in other diseases classified elsewhere without behavioral disturbance: Secondary | ICD-10-CM | POA: Diagnosis not present

## 2019-04-11 DIAGNOSIS — M25551 Pain in right hip: Secondary | ICD-10-CM | POA: Diagnosis not present

## 2019-04-11 DIAGNOSIS — R5383 Other fatigue: Secondary | ICD-10-CM | POA: Diagnosis not present

## 2019-04-11 DIAGNOSIS — R636 Underweight: Secondary | ICD-10-CM | POA: Diagnosis present

## 2019-04-11 DIAGNOSIS — W1830XA Fall on same level, unspecified, initial encounter: Secondary | ICD-10-CM | POA: Diagnosis not present

## 2019-04-11 DIAGNOSIS — Z9181 History of falling: Secondary | ICD-10-CM | POA: Diagnosis not present

## 2019-04-11 DIAGNOSIS — Z681 Body mass index (BMI) 19 or less, adult: Secondary | ICD-10-CM | POA: Diagnosis not present

## 2019-04-11 DIAGNOSIS — S72141D Displaced intertrochanteric fracture of right femur, subsequent encounter for closed fracture with routine healing: Secondary | ICD-10-CM | POA: Diagnosis not present

## 2019-04-11 DIAGNOSIS — G20 Parkinson's disease: Secondary | ICD-10-CM | POA: Diagnosis present

## 2019-04-11 DIAGNOSIS — F0280 Dementia in other diseases classified elsewhere without behavioral disturbance: Secondary | ICD-10-CM | POA: Diagnosis not present

## 2019-04-11 DIAGNOSIS — E039 Hypothyroidism, unspecified: Secondary | ICD-10-CM | POA: Diagnosis not present

## 2019-04-11 DIAGNOSIS — M6281 Muscle weakness (generalized): Secondary | ICD-10-CM | POA: Diagnosis not present

## 2019-04-11 DIAGNOSIS — F0390 Unspecified dementia without behavioral disturbance: Secondary | ICD-10-CM | POA: Diagnosis not present

## 2019-04-11 DIAGNOSIS — R41841 Cognitive communication deficit: Secondary | ICD-10-CM | POA: Diagnosis not present

## 2019-04-11 DIAGNOSIS — R404 Transient alteration of awareness: Secondary | ICD-10-CM | POA: Diagnosis not present

## 2019-04-11 DIAGNOSIS — M1611 Unilateral primary osteoarthritis, right hip: Secondary | ICD-10-CM | POA: Diagnosis present

## 2019-04-11 DIAGNOSIS — Z87891 Personal history of nicotine dependence: Secondary | ICD-10-CM | POA: Diagnosis not present

## 2019-04-11 DIAGNOSIS — R4182 Altered mental status, unspecified: Secondary | ICD-10-CM | POA: Diagnosis not present

## 2019-04-11 DIAGNOSIS — U071 COVID-19: Secondary | ICD-10-CM | POA: Diagnosis not present

## 2019-04-11 DIAGNOSIS — W19XXXA Unspecified fall, initial encounter: Secondary | ICD-10-CM | POA: Diagnosis not present

## 2019-04-11 DIAGNOSIS — Z7401 Bed confinement status: Secondary | ICD-10-CM | POA: Diagnosis not present

## 2019-04-11 DIAGNOSIS — S72141A Displaced intertrochanteric fracture of right femur, initial encounter for closed fracture: Secondary | ICD-10-CM | POA: Diagnosis not present

## 2019-04-11 DIAGNOSIS — R2689 Other abnormalities of gait and mobility: Secondary | ICD-10-CM | POA: Diagnosis not present

## 2019-04-11 DIAGNOSIS — Z20822 Contact with and (suspected) exposure to covid-19: Secondary | ICD-10-CM | POA: Diagnosis present

## 2019-04-11 DIAGNOSIS — S299XXA Unspecified injury of thorax, initial encounter: Secondary | ICD-10-CM | POA: Diagnosis not present

## 2019-04-11 DIAGNOSIS — D649 Anemia, unspecified: Secondary | ICD-10-CM | POA: Diagnosis present

## 2019-04-11 DIAGNOSIS — M8580 Other specified disorders of bone density and structure, unspecified site: Secondary | ICD-10-CM | POA: Diagnosis present

## 2019-04-11 DIAGNOSIS — Z8616 Personal history of COVID-19: Secondary | ICD-10-CM | POA: Diagnosis not present

## 2019-04-12 DIAGNOSIS — R5383 Other fatigue: Secondary | ICD-10-CM | POA: Diagnosis not present

## 2019-04-12 DIAGNOSIS — U071 COVID-19: Secondary | ICD-10-CM | POA: Diagnosis not present

## 2019-04-12 DIAGNOSIS — G20 Parkinson's disease: Secondary | ICD-10-CM | POA: Diagnosis not present

## 2019-04-12 DIAGNOSIS — F0280 Dementia in other diseases classified elsewhere without behavioral disturbance: Secondary | ICD-10-CM | POA: Diagnosis not present

## 2019-04-12 DIAGNOSIS — S72141D Displaced intertrochanteric fracture of right femur, subsequent encounter for closed fracture with routine healing: Secondary | ICD-10-CM | POA: Diagnosis not present

## 2019-04-12 DIAGNOSIS — S72141A Displaced intertrochanteric fracture of right femur, initial encounter for closed fracture: Secondary | ICD-10-CM | POA: Diagnosis not present

## 2019-04-12 HISTORY — PX: FEMUR FRACTURE SURGERY: SHX633

## 2019-04-15 DIAGNOSIS — E7800 Pure hypercholesterolemia, unspecified: Secondary | ICD-10-CM | POA: Diagnosis not present

## 2019-04-15 DIAGNOSIS — S72141A Displaced intertrochanteric fracture of right femur, initial encounter for closed fracture: Secondary | ICD-10-CM | POA: Diagnosis not present

## 2019-04-15 DIAGNOSIS — R2689 Other abnormalities of gait and mobility: Secondary | ICD-10-CM | POA: Diagnosis not present

## 2019-04-15 DIAGNOSIS — M25551 Pain in right hip: Secondary | ICD-10-CM | POA: Diagnosis not present

## 2019-04-15 DIAGNOSIS — R4182 Altered mental status, unspecified: Secondary | ICD-10-CM | POA: Diagnosis not present

## 2019-04-15 DIAGNOSIS — F0390 Unspecified dementia without behavioral disturbance: Secondary | ICD-10-CM | POA: Diagnosis not present

## 2019-04-15 DIAGNOSIS — Z7401 Bed confinement status: Secondary | ICD-10-CM | POA: Diagnosis not present

## 2019-04-15 DIAGNOSIS — I447 Left bundle-branch block, unspecified: Secondary | ICD-10-CM | POA: Diagnosis not present

## 2019-04-15 DIAGNOSIS — S72141P Displaced intertrochanteric fracture of right femur, subsequent encounter for closed fracture with malunion: Secondary | ICD-10-CM | POA: Diagnosis not present

## 2019-04-15 DIAGNOSIS — G20 Parkinson's disease: Secondary | ICD-10-CM | POA: Diagnosis not present

## 2019-04-15 DIAGNOSIS — S72144A Nondisplaced intertrochanteric fracture of right femur, initial encounter for closed fracture: Secondary | ICD-10-CM | POA: Diagnosis not present

## 2019-04-15 DIAGNOSIS — F0280 Dementia in other diseases classified elsewhere without behavioral disturbance: Secondary | ICD-10-CM | POA: Diagnosis not present

## 2019-04-15 DIAGNOSIS — D62 Acute posthemorrhagic anemia: Secondary | ICD-10-CM | POA: Diagnosis not present

## 2019-04-15 DIAGNOSIS — Z9181 History of falling: Secondary | ICD-10-CM | POA: Diagnosis not present

## 2019-04-15 DIAGNOSIS — S72001D Fracture of unspecified part of neck of right femur, subsequent encounter for closed fracture with routine healing: Secondary | ICD-10-CM | POA: Diagnosis not present

## 2019-04-15 DIAGNOSIS — U071 COVID-19: Secondary | ICD-10-CM | POA: Diagnosis not present

## 2019-04-15 DIAGNOSIS — W19XXXA Unspecified fall, initial encounter: Secondary | ICD-10-CM | POA: Diagnosis not present

## 2019-04-15 DIAGNOSIS — M1611 Unilateral primary osteoarthritis, right hip: Secondary | ICD-10-CM | POA: Diagnosis not present

## 2019-04-15 DIAGNOSIS — S72141D Displaced intertrochanteric fracture of right femur, subsequent encounter for closed fracture with routine healing: Secondary | ICD-10-CM | POA: Diagnosis not present

## 2019-04-15 DIAGNOSIS — R41841 Cognitive communication deficit: Secondary | ICD-10-CM | POA: Diagnosis not present

## 2019-04-15 DIAGNOSIS — Z8616 Personal history of COVID-19: Secondary | ICD-10-CM | POA: Diagnosis not present

## 2019-04-15 DIAGNOSIS — M79651 Pain in right thigh: Secondary | ICD-10-CM | POA: Diagnosis not present

## 2019-04-15 DIAGNOSIS — R404 Transient alteration of awareness: Secondary | ICD-10-CM | POA: Diagnosis not present

## 2019-04-15 DIAGNOSIS — M6281 Muscle weakness (generalized): Secondary | ICD-10-CM | POA: Diagnosis not present

## 2019-04-15 DIAGNOSIS — M8580 Other specified disorders of bone density and structure, unspecified site: Secondary | ICD-10-CM | POA: Diagnosis not present

## 2019-04-15 DIAGNOSIS — Z87891 Personal history of nicotine dependence: Secondary | ICD-10-CM | POA: Diagnosis not present

## 2019-04-15 DIAGNOSIS — E039 Hypothyroidism, unspecified: Secondary | ICD-10-CM | POA: Diagnosis not present

## 2019-04-15 DIAGNOSIS — Z681 Body mass index (BMI) 19 or less, adult: Secondary | ICD-10-CM | POA: Diagnosis not present

## 2019-04-16 DIAGNOSIS — G20 Parkinson's disease: Secondary | ICD-10-CM | POA: Diagnosis not present

## 2019-04-16 DIAGNOSIS — S72141A Displaced intertrochanteric fracture of right femur, initial encounter for closed fracture: Secondary | ICD-10-CM | POA: Diagnosis not present

## 2019-04-16 DIAGNOSIS — F0280 Dementia in other diseases classified elsewhere without behavioral disturbance: Secondary | ICD-10-CM | POA: Diagnosis not present

## 2019-04-16 DIAGNOSIS — M25551 Pain in right hip: Secondary | ICD-10-CM | POA: Diagnosis not present

## 2019-04-21 ENCOUNTER — Other Ambulatory Visit: Payer: Self-pay | Admitting: *Deleted

## 2019-04-21 DIAGNOSIS — U071 COVID-19: Secondary | ICD-10-CM | POA: Diagnosis not present

## 2019-04-21 DIAGNOSIS — S72009D Fracture of unspecified part of neck of unspecified femur, subsequent encounter for closed fracture with routine healing: Secondary | ICD-10-CM | POA: Insufficient documentation

## 2019-04-21 DIAGNOSIS — K4090 Unilateral inguinal hernia, without obstruction or gangrene, not specified as recurrent: Secondary | ICD-10-CM | POA: Insufficient documentation

## 2019-04-21 DIAGNOSIS — R972 Elevated prostate specific antigen [PSA]: Secondary | ICD-10-CM | POA: Insufficient documentation

## 2019-04-21 DIAGNOSIS — F17200 Nicotine dependence, unspecified, uncomplicated: Secondary | ICD-10-CM | POA: Insufficient documentation

## 2019-04-21 NOTE — Patient Outreach (Signed)
Late entry for 04/20/19  Screened for potential [facility] Care Management needs as a benefit of  [employer].  Mr. Kear is currently receiving skilled therapy at [facility].  Spoke with [facility] RN after she attended telephonic interdisciplinary team meeting to assess for disposition needs and transition plan for resident.   Mr. Lavanway is s/p ORIF femur fracture.  He lived with wife prior. Transition plan is for member to return home upon SNF dc.  Will continue to follow for transition plans while member resides in [facility].   Will plan outreach as appropriate.  Marthenia Rolling, MSN-Ed, RN,BSN [place] Acute Care Coordinator [phone] [facility]) [phone]  (Toll free office)

## 2019-04-26 DIAGNOSIS — S72001D Fracture of unspecified part of neck of right femur, subsequent encounter for closed fracture with routine healing: Secondary | ICD-10-CM | POA: Diagnosis not present

## 2019-05-03 DIAGNOSIS — S72141P Displaced intertrochanteric fracture of right femur, subsequent encounter for closed fracture with malunion: Secondary | ICD-10-CM | POA: Diagnosis not present

## 2019-05-04 DIAGNOSIS — F0280 Dementia in other diseases classified elsewhere without behavioral disturbance: Secondary | ICD-10-CM | POA: Diagnosis not present

## 2019-05-04 DIAGNOSIS — E039 Hypothyroidism, unspecified: Secondary | ICD-10-CM | POA: Diagnosis not present

## 2019-05-04 DIAGNOSIS — G20 Parkinson's disease: Secondary | ICD-10-CM | POA: Diagnosis not present

## 2019-05-04 DIAGNOSIS — S72144A Nondisplaced intertrochanteric fracture of right femur, initial encounter for closed fracture: Secondary | ICD-10-CM | POA: Diagnosis not present

## 2019-05-05 DIAGNOSIS — S72141D Displaced intertrochanteric fracture of right femur, subsequent encounter for closed fracture with routine healing: Secondary | ICD-10-CM | POA: Diagnosis not present

## 2019-05-05 DIAGNOSIS — D62 Acute posthemorrhagic anemia: Secondary | ICD-10-CM | POA: Diagnosis not present

## 2019-05-05 DIAGNOSIS — M1611 Unilateral primary osteoarthritis, right hip: Secondary | ICD-10-CM | POA: Diagnosis not present

## 2019-05-05 DIAGNOSIS — M8580 Other specified disorders of bone density and structure, unspecified site: Secondary | ICD-10-CM | POA: Diagnosis not present

## 2019-05-05 DIAGNOSIS — F0280 Dementia in other diseases classified elsewhere without behavioral disturbance: Secondary | ICD-10-CM | POA: Diagnosis not present

## 2019-05-05 DIAGNOSIS — G20 Parkinson's disease: Secondary | ICD-10-CM | POA: Diagnosis not present

## 2019-05-08 ENCOUNTER — Other Ambulatory Visit: Payer: Self-pay | Admitting: *Deleted

## 2019-05-08 ENCOUNTER — Other Ambulatory Visit: Payer: Self-pay | Admitting: Orthopedic Surgery

## 2019-05-08 DIAGNOSIS — G20 Parkinson's disease: Secondary | ICD-10-CM | POA: Diagnosis not present

## 2019-05-08 DIAGNOSIS — S72141D Displaced intertrochanteric fracture of right femur, subsequent encounter for closed fracture with routine healing: Secondary | ICD-10-CM | POA: Diagnosis not present

## 2019-05-08 DIAGNOSIS — S72141P Displaced intertrochanteric fracture of right femur, subsequent encounter for closed fracture with malunion: Secondary | ICD-10-CM

## 2019-05-08 DIAGNOSIS — M8580 Other specified disorders of bone density and structure, unspecified site: Secondary | ICD-10-CM | POA: Diagnosis not present

## 2019-05-08 DIAGNOSIS — R5381 Other malaise: Secondary | ICD-10-CM

## 2019-05-08 DIAGNOSIS — S72001P Fracture of unspecified part of neck of right femur, subsequent encounter for closed fracture with malunion: Secondary | ICD-10-CM

## 2019-05-08 DIAGNOSIS — F0280 Dementia in other diseases classified elsewhere without behavioral disturbance: Secondary | ICD-10-CM | POA: Diagnosis not present

## 2019-05-08 DIAGNOSIS — D62 Acute posthemorrhagic anemia: Secondary | ICD-10-CM | POA: Diagnosis not present

## 2019-05-08 DIAGNOSIS — M1611 Unilateral primary osteoarthritis, right hip: Secondary | ICD-10-CM | POA: Diagnosis not present

## 2019-05-08 NOTE — Patient Outreach (Signed)
Member screened for potential [facility] Care Management needs as a benefit of [place] [employer].  Verified in Patient Seth Stout that Seth Stout discharged from [facility] on 05/05/19. Appears he has [place] branch.   Member lives with wife and has caregivers.  Telephone call made Timmie Dinsdale (wife) [phone]. Patient identifiers confirmed. Explained [place] Management services. Mrs. Verdi agreeable.   Mr. Wandling has history of dementia, Parkinson's, recent COVID in Dec '20, HLD, anemia.   Will make referral to [place] Management for RNCM.    Marthenia Rolling, MSN-Ed, RN,BSN [place] Acute Care Coordinator [phone] [facility]) [phone]  (Toll free office)

## 2019-05-09 ENCOUNTER — Other Ambulatory Visit: Payer: Self-pay | Admitting: *Deleted

## 2019-05-09 DIAGNOSIS — M8580 Other specified disorders of bone density and structure, unspecified site: Secondary | ICD-10-CM | POA: Diagnosis not present

## 2019-05-09 DIAGNOSIS — D62 Acute posthemorrhagic anemia: Secondary | ICD-10-CM | POA: Diagnosis not present

## 2019-05-09 DIAGNOSIS — S72141D Displaced intertrochanteric fracture of right femur, subsequent encounter for closed fracture with routine healing: Secondary | ICD-10-CM | POA: Diagnosis not present

## 2019-05-09 DIAGNOSIS — F0280 Dementia in other diseases classified elsewhere without behavioral disturbance: Secondary | ICD-10-CM | POA: Diagnosis not present

## 2019-05-09 DIAGNOSIS — M1611 Unilateral primary osteoarthritis, right hip: Secondary | ICD-10-CM | POA: Diagnosis not present

## 2019-05-09 DIAGNOSIS — G20 Parkinson's disease: Secondary | ICD-10-CM | POA: Diagnosis not present

## 2019-05-09 NOTE — Patient Outreach (Signed)
Initial telephone outreach for [place] Management. Pt referred from [place] for community program. Pt's wife has agreed to this service.  Today I was not able to reach anyone at either the home or cell phone listed. I was able to leave a message and requested a return call.  Seth Stout. [name], MSN, [facility] Gerontological Nurse Practitioner [facility] Care Management [phone]

## 2019-05-11 DIAGNOSIS — M1611 Unilateral primary osteoarthritis, right hip: Secondary | ICD-10-CM | POA: Diagnosis not present

## 2019-05-11 DIAGNOSIS — M8580 Other specified disorders of bone density and structure, unspecified site: Secondary | ICD-10-CM | POA: Diagnosis not present

## 2019-05-11 DIAGNOSIS — F0280 Dementia in other diseases classified elsewhere without behavioral disturbance: Secondary | ICD-10-CM | POA: Diagnosis not present

## 2019-05-11 DIAGNOSIS — S72141D Displaced intertrochanteric fracture of right femur, subsequent encounter for closed fracture with routine healing: Secondary | ICD-10-CM | POA: Diagnosis not present

## 2019-05-11 DIAGNOSIS — D62 Acute posthemorrhagic anemia: Secondary | ICD-10-CM | POA: Diagnosis not present

## 2019-05-11 DIAGNOSIS — G20 Parkinson's disease: Secondary | ICD-10-CM | POA: Diagnosis not present

## 2019-05-12 ENCOUNTER — Encounter: Payer: Self-pay | Admitting: *Deleted

## 2019-05-12 ENCOUNTER — Ambulatory Visit
Admission: RE | Admit: 2019-05-12 | Discharge: 2019-05-12 | Disposition: A | Discharge status: Home / Self Care | Payer: Medicare Other | Source: Ambulatory Visit | Attending: Orthopedic Surgery | Admitting: Orthopedic Surgery

## 2019-05-12 ENCOUNTER — Other Ambulatory Visit: Payer: Self-pay | Admitting: *Deleted

## 2019-05-12 DIAGNOSIS — M8580 Other specified disorders of bone density and structure, unspecified site: Secondary | ICD-10-CM | POA: Diagnosis not present

## 2019-05-12 DIAGNOSIS — S72141P Displaced intertrochanteric fracture of right femur, subsequent encounter for closed fracture with malunion: Secondary | ICD-10-CM

## 2019-05-12 DIAGNOSIS — S72141A Displaced intertrochanteric fracture of right femur, initial encounter for closed fracture: Secondary | ICD-10-CM | POA: Diagnosis not present

## 2019-05-12 DIAGNOSIS — D62 Acute posthemorrhagic anemia: Secondary | ICD-10-CM | POA: Diagnosis not present

## 2019-05-12 DIAGNOSIS — G20 Parkinson's disease: Secondary | ICD-10-CM | POA: Diagnosis not present

## 2019-05-12 DIAGNOSIS — M1612 Unilateral primary osteoarthritis, left hip: Secondary | ICD-10-CM | POA: Diagnosis not present

## 2019-05-12 DIAGNOSIS — M1611 Unilateral primary osteoarthritis, right hip: Secondary | ICD-10-CM | POA: Diagnosis not present

## 2019-05-12 DIAGNOSIS — S72141D Displaced intertrochanteric fracture of right femur, subsequent encounter for closed fracture with routine healing: Secondary | ICD-10-CM | POA: Diagnosis not present

## 2019-05-12 DIAGNOSIS — F0280 Dementia in other diseases classified elsewhere without behavioral disturbance: Secondary | ICD-10-CM | POA: Diagnosis not present

## 2019-05-12 NOTE — Patient Outreach (Signed)
Transition of care call, second attempt.  Talked with pt niece who had taken him in for a scheduled CT scan. She was not on pt contact list. Advised I would have to get pt permission. She will call me back when they are both able to talk.  Debby Bud, pt's niece returned my call and Seth Stout was on speaker with me. Verified pt and asked if I could talk with Seth Stout and put her on his contact list. He agreed to this. Seth Stout suggested that I put his brother on the list also and Seth Stout agreed to put Seth Stout on his contact list.  Advised pt and niece my role with [facility] Care Management and that I would like to call weekly over the next month to ensure he is progressing, offer support and education. They have agreed to this service. Will send [place] Management welcome packet including pt rights and responsibilities.  Seth Stout acknowledges that [name] and his wife have 24 hour caregivers in place. Mrs. Hurney is in ESRD and goes to dialysis Tues/Thurs/Sats.   He is eating well, gaining wt (he had lost considerable wt down to 137 and he is 6'2". Today's wt is 147. His ususal wt is 160#.  He slept well last night.   Patient was recently discharged from hospital and all medications have been reviewed. Outpatient Encounter Medications as of 05/12/2019  Medication Sig  . aspirin 81 MG EC tablet Take by mouth.  . carbidopa-levodopa (SINEMET IR) 25-100 MG tablet   . donepezil (ARICEPT) 5 MG tablet Take by mouth.  . levothyroxine (SYNTHROID) 25 MCG tablet Take by mouth.  . memantine (NAMENDA) 10 MG tablet Take by mouth.   No facility-administered encounter medications on file as of 05/12/2019.    Agree to talk next week for more complete collection of information. My phone number provided for contact for problems.  [name]. [name], MSN, [facility] Gerontological Nurse Practitioner [facility] Care Management [phone]    [name]. [name], MSN, [facility] Gerontological Nurse Practitioner [facility] Care  Management [phone]

## 2019-05-15 ENCOUNTER — Ambulatory Visit: Payer: Self-pay | Admitting: *Deleted

## 2019-05-15 DIAGNOSIS — G20 Parkinson's disease: Secondary | ICD-10-CM | POA: Diagnosis not present

## 2019-05-15 DIAGNOSIS — S72141D Displaced intertrochanteric fracture of right femur, subsequent encounter for closed fracture with routine healing: Secondary | ICD-10-CM | POA: Diagnosis not present

## 2019-05-15 DIAGNOSIS — M1611 Unilateral primary osteoarthritis, right hip: Secondary | ICD-10-CM | POA: Diagnosis not present

## 2019-05-15 DIAGNOSIS — M8580 Other specified disorders of bone density and structure, unspecified site: Secondary | ICD-10-CM | POA: Diagnosis not present

## 2019-05-15 DIAGNOSIS — D62 Acute posthemorrhagic anemia: Secondary | ICD-10-CM | POA: Diagnosis not present

## 2019-05-15 DIAGNOSIS — F0280 Dementia in other diseases classified elsewhere without behavioral disturbance: Secondary | ICD-10-CM | POA: Diagnosis not present

## 2019-05-16 DIAGNOSIS — M8580 Other specified disorders of bone density and structure, unspecified site: Secondary | ICD-10-CM | POA: Diagnosis not present

## 2019-05-16 DIAGNOSIS — D62 Acute posthemorrhagic anemia: Secondary | ICD-10-CM | POA: Diagnosis not present

## 2019-05-16 DIAGNOSIS — F0280 Dementia in other diseases classified elsewhere without behavioral disturbance: Secondary | ICD-10-CM | POA: Diagnosis not present

## 2019-05-16 DIAGNOSIS — S72141D Displaced intertrochanteric fracture of right femur, subsequent encounter for closed fracture with routine healing: Secondary | ICD-10-CM | POA: Diagnosis not present

## 2019-05-16 DIAGNOSIS — M1611 Unilateral primary osteoarthritis, right hip: Secondary | ICD-10-CM | POA: Diagnosis not present

## 2019-05-16 DIAGNOSIS — G20 Parkinson's disease: Secondary | ICD-10-CM | POA: Diagnosis not present

## 2019-05-18 DIAGNOSIS — Z299 Encounter for prophylactic measures, unspecified: Secondary | ICD-10-CM | POA: Diagnosis not present

## 2019-05-18 DIAGNOSIS — G20 Parkinson's disease: Secondary | ICD-10-CM | POA: Diagnosis not present

## 2019-05-18 DIAGNOSIS — E039 Hypothyroidism, unspecified: Secondary | ICD-10-CM | POA: Diagnosis not present

## 2019-05-18 DIAGNOSIS — F0390 Unspecified dementia without behavioral disturbance: Secondary | ICD-10-CM | POA: Diagnosis not present

## 2019-05-18 DIAGNOSIS — E7800 Pure hypercholesterolemia, unspecified: Secondary | ICD-10-CM | POA: Diagnosis not present

## 2019-05-19 DIAGNOSIS — M8580 Other specified disorders of bone density and structure, unspecified site: Secondary | ICD-10-CM | POA: Diagnosis not present

## 2019-05-19 DIAGNOSIS — G20 Parkinson's disease: Secondary | ICD-10-CM | POA: Diagnosis not present

## 2019-05-19 DIAGNOSIS — F0280 Dementia in other diseases classified elsewhere without behavioral disturbance: Secondary | ICD-10-CM | POA: Diagnosis not present

## 2019-05-19 DIAGNOSIS — M1611 Unilateral primary osteoarthritis, right hip: Secondary | ICD-10-CM | POA: Diagnosis not present

## 2019-05-19 DIAGNOSIS — D62 Acute posthemorrhagic anemia: Secondary | ICD-10-CM | POA: Diagnosis not present

## 2019-05-19 DIAGNOSIS — S72141D Displaced intertrochanteric fracture of right femur, subsequent encounter for closed fracture with routine healing: Secondary | ICD-10-CM | POA: Diagnosis not present

## 2019-05-22 DIAGNOSIS — F0280 Dementia in other diseases classified elsewhere without behavioral disturbance: Secondary | ICD-10-CM | POA: Diagnosis not present

## 2019-05-22 DIAGNOSIS — S72141D Displaced intertrochanteric fracture of right femur, subsequent encounter for closed fracture with routine healing: Secondary | ICD-10-CM | POA: Diagnosis not present

## 2019-05-22 DIAGNOSIS — M8580 Other specified disorders of bone density and structure, unspecified site: Secondary | ICD-10-CM | POA: Diagnosis not present

## 2019-05-22 DIAGNOSIS — D62 Acute posthemorrhagic anemia: Secondary | ICD-10-CM | POA: Diagnosis not present

## 2019-05-22 DIAGNOSIS — M1611 Unilateral primary osteoarthritis, right hip: Secondary | ICD-10-CM | POA: Diagnosis not present

## 2019-05-22 DIAGNOSIS — G20 Parkinson's disease: Secondary | ICD-10-CM | POA: Diagnosis not present

## 2019-05-23 DIAGNOSIS — G20 Parkinson's disease: Secondary | ICD-10-CM | POA: Diagnosis not present

## 2019-05-23 DIAGNOSIS — M1611 Unilateral primary osteoarthritis, right hip: Secondary | ICD-10-CM | POA: Diagnosis not present

## 2019-05-23 DIAGNOSIS — M8580 Other specified disorders of bone density and structure, unspecified site: Secondary | ICD-10-CM | POA: Diagnosis not present

## 2019-05-23 DIAGNOSIS — D62 Acute posthemorrhagic anemia: Secondary | ICD-10-CM | POA: Diagnosis not present

## 2019-05-23 DIAGNOSIS — S72141D Displaced intertrochanteric fracture of right femur, subsequent encounter for closed fracture with routine healing: Secondary | ICD-10-CM | POA: Diagnosis not present

## 2019-05-23 DIAGNOSIS — F0280 Dementia in other diseases classified elsewhere without behavioral disturbance: Secondary | ICD-10-CM | POA: Diagnosis not present

## 2019-05-24 DIAGNOSIS — S72141D Displaced intertrochanteric fracture of right femur, subsequent encounter for closed fracture with routine healing: Secondary | ICD-10-CM | POA: Diagnosis not present

## 2019-05-24 DIAGNOSIS — M8580 Other specified disorders of bone density and structure, unspecified site: Secondary | ICD-10-CM | POA: Diagnosis not present

## 2019-05-24 DIAGNOSIS — M1611 Unilateral primary osteoarthritis, right hip: Secondary | ICD-10-CM | POA: Diagnosis not present

## 2019-05-24 DIAGNOSIS — D62 Acute posthemorrhagic anemia: Secondary | ICD-10-CM | POA: Diagnosis not present

## 2019-05-24 DIAGNOSIS — F0280 Dementia in other diseases classified elsewhere without behavioral disturbance: Secondary | ICD-10-CM | POA: Diagnosis not present

## 2019-05-24 DIAGNOSIS — G20 Parkinson's disease: Secondary | ICD-10-CM | POA: Diagnosis not present

## 2019-05-25 DIAGNOSIS — S72141D Displaced intertrochanteric fracture of right femur, subsequent encounter for closed fracture with routine healing: Secondary | ICD-10-CM | POA: Diagnosis not present

## 2019-05-25 DIAGNOSIS — D62 Acute posthemorrhagic anemia: Secondary | ICD-10-CM | POA: Diagnosis not present

## 2019-05-25 DIAGNOSIS — M1611 Unilateral primary osteoarthritis, right hip: Secondary | ICD-10-CM | POA: Diagnosis not present

## 2019-05-25 DIAGNOSIS — F0280 Dementia in other diseases classified elsewhere without behavioral disturbance: Secondary | ICD-10-CM | POA: Diagnosis not present

## 2019-05-25 DIAGNOSIS — G20 Parkinson's disease: Secondary | ICD-10-CM | POA: Diagnosis not present

## 2019-05-25 DIAGNOSIS — M8580 Other specified disorders of bone density and structure, unspecified site: Secondary | ICD-10-CM | POA: Diagnosis not present

## 2019-05-29 DIAGNOSIS — F0280 Dementia in other diseases classified elsewhere without behavioral disturbance: Secondary | ICD-10-CM | POA: Diagnosis not present

## 2019-05-29 DIAGNOSIS — S72141D Displaced intertrochanteric fracture of right femur, subsequent encounter for closed fracture with routine healing: Secondary | ICD-10-CM | POA: Diagnosis not present

## 2019-05-29 DIAGNOSIS — D62 Acute posthemorrhagic anemia: Secondary | ICD-10-CM | POA: Diagnosis not present

## 2019-05-29 DIAGNOSIS — M1611 Unilateral primary osteoarthritis, right hip: Secondary | ICD-10-CM | POA: Diagnosis not present

## 2019-05-29 DIAGNOSIS — G20 Parkinson's disease: Secondary | ICD-10-CM | POA: Diagnosis not present

## 2019-05-29 DIAGNOSIS — M8580 Other specified disorders of bone density and structure, unspecified site: Secondary | ICD-10-CM | POA: Diagnosis not present

## 2019-05-30 DIAGNOSIS — M8580 Other specified disorders of bone density and structure, unspecified site: Secondary | ICD-10-CM | POA: Diagnosis not present

## 2019-05-30 DIAGNOSIS — M1611 Unilateral primary osteoarthritis, right hip: Secondary | ICD-10-CM | POA: Diagnosis not present

## 2019-05-30 DIAGNOSIS — G20 Parkinson's disease: Secondary | ICD-10-CM | POA: Diagnosis not present

## 2019-05-30 DIAGNOSIS — F0280 Dementia in other diseases classified elsewhere without behavioral disturbance: Secondary | ICD-10-CM | POA: Diagnosis not present

## 2019-05-30 DIAGNOSIS — S72141D Displaced intertrochanteric fracture of right femur, subsequent encounter for closed fracture with routine healing: Secondary | ICD-10-CM | POA: Diagnosis not present

## 2019-05-30 DIAGNOSIS — D62 Acute posthemorrhagic anemia: Secondary | ICD-10-CM | POA: Diagnosis not present

## 2019-05-31 DIAGNOSIS — G20 Parkinson's disease: Secondary | ICD-10-CM | POA: Diagnosis not present

## 2019-05-31 DIAGNOSIS — M8580 Other specified disorders of bone density and structure, unspecified site: Secondary | ICD-10-CM | POA: Diagnosis not present

## 2019-05-31 DIAGNOSIS — F0280 Dementia in other diseases classified elsewhere without behavioral disturbance: Secondary | ICD-10-CM | POA: Diagnosis not present

## 2019-05-31 DIAGNOSIS — D62 Acute posthemorrhagic anemia: Secondary | ICD-10-CM | POA: Diagnosis not present

## 2019-05-31 DIAGNOSIS — M1611 Unilateral primary osteoarthritis, right hip: Secondary | ICD-10-CM | POA: Diagnosis not present

## 2019-05-31 DIAGNOSIS — S72141D Displaced intertrochanteric fracture of right femur, subsequent encounter for closed fracture with routine healing: Secondary | ICD-10-CM | POA: Diagnosis not present

## 2019-06-01 DIAGNOSIS — F0280 Dementia in other diseases classified elsewhere without behavioral disturbance: Secondary | ICD-10-CM | POA: Diagnosis not present

## 2019-06-01 DIAGNOSIS — D62 Acute posthemorrhagic anemia: Secondary | ICD-10-CM | POA: Diagnosis not present

## 2019-06-01 DIAGNOSIS — M1611 Unilateral primary osteoarthritis, right hip: Secondary | ICD-10-CM | POA: Diagnosis not present

## 2019-06-01 DIAGNOSIS — G20 Parkinson's disease: Secondary | ICD-10-CM | POA: Diagnosis not present

## 2019-06-01 DIAGNOSIS — S72141D Displaced intertrochanteric fracture of right femur, subsequent encounter for closed fracture with routine healing: Secondary | ICD-10-CM | POA: Diagnosis not present

## 2019-06-01 DIAGNOSIS — M8580 Other specified disorders of bone density and structure, unspecified site: Secondary | ICD-10-CM | POA: Diagnosis not present

## 2019-06-02 DIAGNOSIS — S72141D Displaced intertrochanteric fracture of right femur, subsequent encounter for closed fracture with routine healing: Secondary | ICD-10-CM | POA: Diagnosis not present

## 2019-06-02 DIAGNOSIS — F0280 Dementia in other diseases classified elsewhere without behavioral disturbance: Secondary | ICD-10-CM | POA: Diagnosis not present

## 2019-06-02 DIAGNOSIS — M1611 Unilateral primary osteoarthritis, right hip: Secondary | ICD-10-CM | POA: Diagnosis not present

## 2019-06-02 DIAGNOSIS — G20 Parkinson's disease: Secondary | ICD-10-CM | POA: Diagnosis not present

## 2019-06-04 DIAGNOSIS — Z9181 History of falling: Secondary | ICD-10-CM | POA: Diagnosis not present

## 2019-06-04 DIAGNOSIS — G20 Parkinson's disease: Secondary | ICD-10-CM | POA: Diagnosis not present

## 2019-06-04 DIAGNOSIS — M8580 Other specified disorders of bone density and structure, unspecified site: Secondary | ICD-10-CM | POA: Diagnosis not present

## 2019-06-04 DIAGNOSIS — S72141D Displaced intertrochanteric fracture of right femur, subsequent encounter for closed fracture with routine healing: Secondary | ICD-10-CM | POA: Diagnosis not present

## 2019-06-04 DIAGNOSIS — F0280 Dementia in other diseases classified elsewhere without behavioral disturbance: Secondary | ICD-10-CM | POA: Diagnosis not present

## 2019-06-04 DIAGNOSIS — M1611 Unilateral primary osteoarthritis, right hip: Secondary | ICD-10-CM | POA: Diagnosis not present

## 2019-06-04 DIAGNOSIS — D62 Acute posthemorrhagic anemia: Secondary | ICD-10-CM | POA: Diagnosis not present

## 2019-06-04 DIAGNOSIS — Z87891 Personal history of nicotine dependence: Secondary | ICD-10-CM | POA: Diagnosis not present

## 2019-06-04 DIAGNOSIS — I447 Left bundle-branch block, unspecified: Secondary | ICD-10-CM | POA: Diagnosis not present

## 2019-06-04 DIAGNOSIS — E7800 Pure hypercholesterolemia, unspecified: Secondary | ICD-10-CM | POA: Diagnosis not present

## 2019-06-04 DIAGNOSIS — E039 Hypothyroidism, unspecified: Secondary | ICD-10-CM | POA: Diagnosis not present

## 2019-06-04 DIAGNOSIS — Z8616 Personal history of COVID-19: Secondary | ICD-10-CM | POA: Diagnosis not present

## 2019-06-05 DIAGNOSIS — F0280 Dementia in other diseases classified elsewhere without behavioral disturbance: Secondary | ICD-10-CM | POA: Diagnosis not present

## 2019-06-05 DIAGNOSIS — M8580 Other specified disorders of bone density and structure, unspecified site: Secondary | ICD-10-CM | POA: Diagnosis not present

## 2019-06-05 DIAGNOSIS — G20 Parkinson's disease: Secondary | ICD-10-CM | POA: Diagnosis not present

## 2019-06-05 DIAGNOSIS — M1611 Unilateral primary osteoarthritis, right hip: Secondary | ICD-10-CM | POA: Diagnosis not present

## 2019-06-05 DIAGNOSIS — D62 Acute posthemorrhagic anemia: Secondary | ICD-10-CM | POA: Diagnosis not present

## 2019-06-05 DIAGNOSIS — S72141D Displaced intertrochanteric fracture of right femur, subsequent encounter for closed fracture with routine healing: Secondary | ICD-10-CM | POA: Diagnosis not present

## 2019-06-07 DIAGNOSIS — S72141D Displaced intertrochanteric fracture of right femur, subsequent encounter for closed fracture with routine healing: Secondary | ICD-10-CM | POA: Diagnosis not present

## 2019-06-07 DIAGNOSIS — D62 Acute posthemorrhagic anemia: Secondary | ICD-10-CM | POA: Diagnosis not present

## 2019-06-07 DIAGNOSIS — F0280 Dementia in other diseases classified elsewhere without behavioral disturbance: Secondary | ICD-10-CM | POA: Diagnosis not present

## 2019-06-07 DIAGNOSIS — M1611 Unilateral primary osteoarthritis, right hip: Secondary | ICD-10-CM | POA: Diagnosis not present

## 2019-06-07 DIAGNOSIS — M8580 Other specified disorders of bone density and structure, unspecified site: Secondary | ICD-10-CM | POA: Diagnosis not present

## 2019-06-07 DIAGNOSIS — G20 Parkinson's disease: Secondary | ICD-10-CM | POA: Diagnosis not present

## 2019-06-08 DIAGNOSIS — M8580 Other specified disorders of bone density and structure, unspecified site: Secondary | ICD-10-CM | POA: Diagnosis not present

## 2019-06-08 DIAGNOSIS — S72141D Displaced intertrochanteric fracture of right femur, subsequent encounter for closed fracture with routine healing: Secondary | ICD-10-CM | POA: Diagnosis not present

## 2019-06-08 DIAGNOSIS — M1611 Unilateral primary osteoarthritis, right hip: Secondary | ICD-10-CM | POA: Diagnosis not present

## 2019-06-08 DIAGNOSIS — G20 Parkinson's disease: Secondary | ICD-10-CM | POA: Diagnosis not present

## 2019-06-08 DIAGNOSIS — D62 Acute posthemorrhagic anemia: Secondary | ICD-10-CM | POA: Diagnosis not present

## 2019-06-08 DIAGNOSIS — F0280 Dementia in other diseases classified elsewhere without behavioral disturbance: Secondary | ICD-10-CM | POA: Diagnosis not present

## 2019-06-14 DIAGNOSIS — M8580 Other specified disorders of bone density and structure, unspecified site: Secondary | ICD-10-CM | POA: Diagnosis not present

## 2019-06-14 DIAGNOSIS — M1611 Unilateral primary osteoarthritis, right hip: Secondary | ICD-10-CM | POA: Diagnosis not present

## 2019-06-14 DIAGNOSIS — G20 Parkinson's disease: Secondary | ICD-10-CM | POA: Diagnosis not present

## 2019-06-14 DIAGNOSIS — S72141D Displaced intertrochanteric fracture of right femur, subsequent encounter for closed fracture with routine healing: Secondary | ICD-10-CM | POA: Diagnosis not present

## 2019-06-14 DIAGNOSIS — F0280 Dementia in other diseases classified elsewhere without behavioral disturbance: Secondary | ICD-10-CM | POA: Diagnosis not present

## 2019-06-14 DIAGNOSIS — D62 Acute posthemorrhagic anemia: Secondary | ICD-10-CM | POA: Diagnosis not present

## 2019-06-19 DIAGNOSIS — Z299 Encounter for prophylactic measures, unspecified: Secondary | ICD-10-CM | POA: Diagnosis not present

## 2019-06-19 DIAGNOSIS — G20 Parkinson's disease: Secondary | ICD-10-CM | POA: Diagnosis not present

## 2019-06-19 DIAGNOSIS — F0390 Unspecified dementia without behavioral disturbance: Secondary | ICD-10-CM | POA: Diagnosis not present

## 2019-06-21 DIAGNOSIS — G309 Alzheimer's disease, unspecified: Secondary | ICD-10-CM | POA: Diagnosis not present

## 2019-06-21 DIAGNOSIS — E7800 Pure hypercholesterolemia, unspecified: Secondary | ICD-10-CM | POA: Diagnosis not present

## 2019-07-05 DIAGNOSIS — I739 Peripheral vascular disease, unspecified: Secondary | ICD-10-CM | POA: Diagnosis not present

## 2019-07-05 DIAGNOSIS — M79671 Pain in right foot: Secondary | ICD-10-CM | POA: Diagnosis not present

## 2019-07-05 DIAGNOSIS — M79672 Pain in left foot: Secondary | ICD-10-CM | POA: Diagnosis not present

## 2019-07-05 DIAGNOSIS — L110 Acquired keratosis follicularis: Secondary | ICD-10-CM | POA: Diagnosis not present

## 2019-07-05 DIAGNOSIS — B351 Tinea unguium: Secondary | ICD-10-CM | POA: Diagnosis not present

## 2019-07-06 DIAGNOSIS — M954 Acquired deformity of chest and rib: Secondary | ICD-10-CM | POA: Diagnosis not present

## 2019-07-06 DIAGNOSIS — E7800 Pure hypercholesterolemia, unspecified: Secondary | ICD-10-CM | POA: Diagnosis not present

## 2019-07-06 DIAGNOSIS — G20 Parkinson's disease: Secondary | ICD-10-CM | POA: Diagnosis not present

## 2019-07-06 DIAGNOSIS — Z6822 Body mass index (BMI) 22.0-22.9, adult: Secondary | ICD-10-CM | POA: Diagnosis not present

## 2019-07-06 DIAGNOSIS — F0390 Unspecified dementia without behavioral disturbance: Secondary | ICD-10-CM | POA: Diagnosis not present

## 2019-07-06 DIAGNOSIS — Z299 Encounter for prophylactic measures, unspecified: Secondary | ICD-10-CM | POA: Diagnosis not present

## 2019-07-11 DIAGNOSIS — R3915 Urgency of urination: Secondary | ICD-10-CM | POA: Diagnosis not present

## 2019-07-11 DIAGNOSIS — R3912 Poor urinary stream: Secondary | ICD-10-CM | POA: Diagnosis not present

## 2019-07-11 DIAGNOSIS — R351 Nocturia: Secondary | ICD-10-CM | POA: Diagnosis not present

## 2019-07-11 DIAGNOSIS — N401 Enlarged prostate with lower urinary tract symptoms: Secondary | ICD-10-CM | POA: Diagnosis not present

## 2019-07-11 DIAGNOSIS — R972 Elevated prostate specific antigen [PSA]: Secondary | ICD-10-CM | POA: Diagnosis not present

## 2019-08-06 DIAGNOSIS — E7800 Pure hypercholesterolemia, unspecified: Secondary | ICD-10-CM | POA: Diagnosis not present

## 2019-08-06 DIAGNOSIS — G309 Alzheimer's disease, unspecified: Secondary | ICD-10-CM | POA: Diagnosis not present

## 2019-08-21 DIAGNOSIS — F0390 Unspecified dementia without behavioral disturbance: Secondary | ICD-10-CM | POA: Diagnosis not present

## 2019-08-21 DIAGNOSIS — E039 Hypothyroidism, unspecified: Secondary | ICD-10-CM | POA: Diagnosis not present

## 2019-08-21 DIAGNOSIS — Z299 Encounter for prophylactic measures, unspecified: Secondary | ICD-10-CM | POA: Diagnosis not present

## 2019-08-21 DIAGNOSIS — G20 Parkinson's disease: Secondary | ICD-10-CM | POA: Diagnosis not present

## 2019-08-21 DIAGNOSIS — R972 Elevated prostate specific antigen [PSA]: Secondary | ICD-10-CM | POA: Diagnosis not present

## 2019-09-06 DIAGNOSIS — E7800 Pure hypercholesterolemia, unspecified: Secondary | ICD-10-CM | POA: Diagnosis not present

## 2019-09-06 DIAGNOSIS — G309 Alzheimer's disease, unspecified: Secondary | ICD-10-CM | POA: Diagnosis not present

## 2019-09-20 DIAGNOSIS — M79671 Pain in right foot: Secondary | ICD-10-CM | POA: Diagnosis not present

## 2019-09-20 DIAGNOSIS — M79672 Pain in left foot: Secondary | ICD-10-CM | POA: Diagnosis not present

## 2019-09-20 DIAGNOSIS — I739 Peripheral vascular disease, unspecified: Secondary | ICD-10-CM | POA: Diagnosis not present

## 2019-09-20 DIAGNOSIS — B351 Tinea unguium: Secondary | ICD-10-CM | POA: Diagnosis not present

## 2019-09-20 DIAGNOSIS — L110 Acquired keratosis follicularis: Secondary | ICD-10-CM | POA: Diagnosis not present

## 2019-10-06 DIAGNOSIS — E7800 Pure hypercholesterolemia, unspecified: Secondary | ICD-10-CM | POA: Diagnosis not present

## 2019-10-06 DIAGNOSIS — G309 Alzheimer's disease, unspecified: Secondary | ICD-10-CM | POA: Diagnosis not present

## 2019-10-10 ENCOUNTER — Other Ambulatory Visit: Payer: Self-pay | Admitting: *Deleted

## 2019-10-10 NOTE — Patient Outreach (Signed)
[place] [facility]) Care Management  10/10/2019  Seth Stout 31-Mar-1933 [id number]  Telephone outreach to reconnect with pt's family. Called pt's home number to talk with [name] and did not get an answer but was able to leave a message to return my call.  Called pt's niece, Debby Bud, and left a message for her to return my call for updates and request resumption of care management services.  [name]. [name], MSN, [facility] Gerontological Nurse Practitioner [facility] Care Management [phone]

## 2019-10-17 ENCOUNTER — Other Ambulatory Visit: Payer: Self-pay | Admitting: *Deleted

## 2019-10-17 NOTE — Patient Outreach (Signed)
[place] [facility]) Care Management  10/17/2019  Seth Stout 1933/05/26 [id number]   Second attempt to re-connect with pt/family, unsuccessful, but left message for a return call.  If no call back today will send unsuccessful letter.  [name]. [name], MSN, GNP-BC Gerontological Nurse Practitioner [facility] Care Management [phone]  Incoming call from Debby Bud, pt's niece and one of his caregivers. She reports he is doing very well. He has 24 hour care givers and no further issues that need to be resolved or addressed.  Will close his case.  [name]. [name], MSN, [facility] Gerontological Nurse Practitioner [facility] Care Management [phone]

## 2019-11-06 DIAGNOSIS — G309 Alzheimer's disease, unspecified: Secondary | ICD-10-CM | POA: Diagnosis not present

## 2019-11-06 DIAGNOSIS — E7800 Pure hypercholesterolemia, unspecified: Secondary | ICD-10-CM | POA: Diagnosis not present

## 2019-12-08 DIAGNOSIS — Z23 Encounter for immunization: Secondary | ICD-10-CM | POA: Diagnosis not present

## 2019-12-27 DIAGNOSIS — B351 Tinea unguium: Secondary | ICD-10-CM | POA: Diagnosis not present

## 2019-12-27 DIAGNOSIS — M79671 Pain in right foot: Secondary | ICD-10-CM | POA: Diagnosis not present

## 2019-12-27 DIAGNOSIS — I739 Peripheral vascular disease, unspecified: Secondary | ICD-10-CM | POA: Diagnosis not present

## 2019-12-27 DIAGNOSIS — L110 Acquired keratosis follicularis: Secondary | ICD-10-CM | POA: Diagnosis not present

## 2019-12-27 DIAGNOSIS — M79672 Pain in left foot: Secondary | ICD-10-CM | POA: Diagnosis not present

## 2020-01-05 DIAGNOSIS — Z23 Encounter for immunization: Secondary | ICD-10-CM | POA: Diagnosis not present

## 2020-01-25 DIAGNOSIS — I447 Left bundle-branch block, unspecified: Secondary | ICD-10-CM | POA: Diagnosis not present

## 2020-01-25 DIAGNOSIS — J449 Chronic obstructive pulmonary disease, unspecified: Secondary | ICD-10-CM | POA: Diagnosis not present

## 2020-01-25 DIAGNOSIS — E039 Hypothyroidism, unspecified: Secondary | ICD-10-CM | POA: Diagnosis not present

## 2020-01-25 DIAGNOSIS — R059 Cough, unspecified: Secondary | ICD-10-CM | POA: Diagnosis not present

## 2020-01-25 DIAGNOSIS — R9431 Abnormal electrocardiogram [ECG] [EKG]: Secondary | ICD-10-CM | POA: Diagnosis not present

## 2020-01-25 DIAGNOSIS — Z720 Tobacco use: Secondary | ICD-10-CM | POA: Diagnosis not present

## 2020-01-25 DIAGNOSIS — I517 Cardiomegaly: Secondary | ICD-10-CM | POA: Diagnosis not present

## 2020-01-25 DIAGNOSIS — Z79899 Other long term (current) drug therapy: Secondary | ICD-10-CM | POA: Diagnosis not present

## 2020-01-25 DIAGNOSIS — F17210 Nicotine dependence, cigarettes, uncomplicated: Secondary | ICD-10-CM | POA: Diagnosis not present

## 2020-01-25 DIAGNOSIS — Z299 Encounter for prophylactic measures, unspecified: Secondary | ICD-10-CM | POA: Diagnosis not present

## 2020-01-25 DIAGNOSIS — J209 Acute bronchitis, unspecified: Secondary | ICD-10-CM | POA: Diagnosis not present

## 2020-01-25 DIAGNOSIS — G20 Parkinson's disease: Secondary | ICD-10-CM | POA: Diagnosis not present

## 2020-01-25 DIAGNOSIS — Z20822 Contact with and (suspected) exposure to covid-19: Secondary | ICD-10-CM | POA: Diagnosis not present

## 2020-01-25 DIAGNOSIS — F0390 Unspecified dementia without behavioral disturbance: Secondary | ICD-10-CM | POA: Diagnosis not present

## 2020-01-25 DIAGNOSIS — E079 Disorder of thyroid, unspecified: Secondary | ICD-10-CM | POA: Diagnosis not present

## 2020-02-06 DIAGNOSIS — F0390 Unspecified dementia without behavioral disturbance: Secondary | ICD-10-CM | POA: Diagnosis not present

## 2020-02-06 DIAGNOSIS — Z299 Encounter for prophylactic measures, unspecified: Secondary | ICD-10-CM | POA: Diagnosis not present

## 2020-02-06 DIAGNOSIS — J209 Acute bronchitis, unspecified: Secondary | ICD-10-CM | POA: Diagnosis not present

## 2020-02-06 DIAGNOSIS — G20 Parkinson's disease: Secondary | ICD-10-CM | POA: Diagnosis not present

## 2020-02-06 DIAGNOSIS — E039 Hypothyroidism, unspecified: Secondary | ICD-10-CM | POA: Diagnosis not present

## 2020-03-06 DIAGNOSIS — Z1339 Encounter for screening examination for other mental health and behavioral disorders: Secondary | ICD-10-CM | POA: Diagnosis not present

## 2020-03-06 DIAGNOSIS — E039 Hypothyroidism, unspecified: Secondary | ICD-10-CM | POA: Diagnosis not present

## 2020-03-06 DIAGNOSIS — Z125 Encounter for screening for malignant neoplasm of prostate: Secondary | ICD-10-CM | POA: Diagnosis not present

## 2020-03-06 DIAGNOSIS — Z79899 Other long term (current) drug therapy: Secondary | ICD-10-CM | POA: Diagnosis not present

## 2020-03-06 DIAGNOSIS — E7800 Pure hypercholesterolemia, unspecified: Secondary | ICD-10-CM | POA: Diagnosis not present

## 2020-03-06 DIAGNOSIS — Z299 Encounter for prophylactic measures, unspecified: Secondary | ICD-10-CM | POA: Diagnosis not present

## 2020-03-06 DIAGNOSIS — Z23 Encounter for immunization: Secondary | ICD-10-CM | POA: Diagnosis not present

## 2020-03-06 DIAGNOSIS — R5383 Other fatigue: Secondary | ICD-10-CM | POA: Diagnosis not present

## 2020-03-06 DIAGNOSIS — Z1331 Encounter for screening for depression: Secondary | ICD-10-CM | POA: Diagnosis not present

## 2020-03-06 DIAGNOSIS — Z7189 Other specified counseling: Secondary | ICD-10-CM | POA: Diagnosis not present

## 2020-03-06 DIAGNOSIS — Z6822 Body mass index (BMI) 22.0-22.9, adult: Secondary | ICD-10-CM | POA: Diagnosis not present

## 2020-03-06 DIAGNOSIS — Z0000 Encounter for general adult medical examination without abnormal findings: Secondary | ICD-10-CM | POA: Diagnosis not present

## 2020-03-12 DIAGNOSIS — F0390 Unspecified dementia without behavioral disturbance: Secondary | ICD-10-CM | POA: Diagnosis not present

## 2020-03-12 DIAGNOSIS — Z299 Encounter for prophylactic measures, unspecified: Secondary | ICD-10-CM | POA: Diagnosis not present

## 2020-03-12 DIAGNOSIS — Z87891 Personal history of nicotine dependence: Secondary | ICD-10-CM | POA: Diagnosis not present

## 2020-03-12 DIAGNOSIS — Z6821 Body mass index (BMI) 21.0-21.9, adult: Secondary | ICD-10-CM | POA: Diagnosis not present

## 2020-03-12 DIAGNOSIS — Z713 Dietary counseling and surveillance: Secondary | ICD-10-CM | POA: Diagnosis not present

## 2020-03-27 DIAGNOSIS — M79671 Pain in right foot: Secondary | ICD-10-CM | POA: Diagnosis not present

## 2020-03-27 DIAGNOSIS — L110 Acquired keratosis follicularis: Secondary | ICD-10-CM | POA: Diagnosis not present

## 2020-03-27 DIAGNOSIS — I739 Peripheral vascular disease, unspecified: Secondary | ICD-10-CM | POA: Diagnosis not present

## 2020-03-27 DIAGNOSIS — B351 Tinea unguium: Secondary | ICD-10-CM | POA: Diagnosis not present

## 2020-03-27 DIAGNOSIS — M79672 Pain in left foot: Secondary | ICD-10-CM | POA: Diagnosis not present

## 2020-05-30 DIAGNOSIS — F17210 Nicotine dependence, cigarettes, uncomplicated: Secondary | ICD-10-CM | POA: Diagnosis not present

## 2020-05-30 DIAGNOSIS — R059 Cough, unspecified: Secondary | ICD-10-CM | POA: Diagnosis not present

## 2020-05-30 DIAGNOSIS — F0390 Unspecified dementia without behavioral disturbance: Secondary | ICD-10-CM | POA: Diagnosis not present

## 2020-05-30 DIAGNOSIS — R5383 Other fatigue: Secondary | ICD-10-CM | POA: Diagnosis not present

## 2020-05-30 DIAGNOSIS — R609 Edema, unspecified: Secondary | ICD-10-CM | POA: Diagnosis not present

## 2020-05-30 DIAGNOSIS — R9431 Abnormal electrocardiogram [ECG] [EKG]: Secondary | ICD-10-CM | POA: Diagnosis not present

## 2020-05-30 DIAGNOSIS — R0602 Shortness of breath: Secondary | ICD-10-CM | POA: Diagnosis not present

## 2020-05-30 DIAGNOSIS — E039 Hypothyroidism, unspecified: Secondary | ICD-10-CM | POA: Diagnosis not present

## 2020-05-30 DIAGNOSIS — R079 Chest pain, unspecified: Secondary | ICD-10-CM | POA: Diagnosis not present

## 2020-05-30 DIAGNOSIS — I447 Left bundle-branch block, unspecified: Secondary | ICD-10-CM | POA: Diagnosis not present

## 2020-05-30 DIAGNOSIS — R972 Elevated prostate specific antigen [PSA]: Secondary | ICD-10-CM | POA: Diagnosis not present

## 2020-05-30 DIAGNOSIS — Z87891 Personal history of nicotine dependence: Secondary | ICD-10-CM | POA: Diagnosis not present

## 2020-05-30 DIAGNOSIS — Z299 Encounter for prophylactic measures, unspecified: Secondary | ICD-10-CM | POA: Diagnosis not present

## 2020-05-31 DIAGNOSIS — Z299 Encounter for prophylactic measures, unspecified: Secondary | ICD-10-CM | POA: Diagnosis not present

## 2020-05-31 DIAGNOSIS — F0390 Unspecified dementia without behavioral disturbance: Secondary | ICD-10-CM | POA: Diagnosis not present

## 2020-05-31 DIAGNOSIS — Z87891 Personal history of nicotine dependence: Secondary | ICD-10-CM | POA: Diagnosis not present

## 2020-05-31 DIAGNOSIS — R0600 Dyspnea, unspecified: Secondary | ICD-10-CM | POA: Diagnosis not present

## 2020-05-31 DIAGNOSIS — Z6822 Body mass index (BMI) 22.0-22.9, adult: Secondary | ICD-10-CM | POA: Diagnosis not present

## 2020-06-03 DIAGNOSIS — R0602 Shortness of breath: Secondary | ICD-10-CM | POA: Diagnosis not present

## 2020-06-12 DIAGNOSIS — M79671 Pain in right foot: Secondary | ICD-10-CM | POA: Diagnosis not present

## 2020-06-12 DIAGNOSIS — I739 Peripheral vascular disease, unspecified: Secondary | ICD-10-CM | POA: Diagnosis not present

## 2020-06-12 DIAGNOSIS — L110 Acquired keratosis follicularis: Secondary | ICD-10-CM | POA: Diagnosis not present

## 2020-06-12 DIAGNOSIS — M79672 Pain in left foot: Secondary | ICD-10-CM | POA: Diagnosis not present

## 2020-06-12 DIAGNOSIS — B351 Tinea unguium: Secondary | ICD-10-CM | POA: Diagnosis not present

## 2020-07-04 DIAGNOSIS — E039 Hypothyroidism, unspecified: Secondary | ICD-10-CM | POA: Diagnosis not present

## 2020-07-04 DIAGNOSIS — I340 Nonrheumatic mitral (valve) insufficiency: Secondary | ICD-10-CM | POA: Diagnosis not present

## 2020-07-04 DIAGNOSIS — G20 Parkinson's disease: Secondary | ICD-10-CM | POA: Diagnosis not present

## 2020-07-04 DIAGNOSIS — F0390 Unspecified dementia without behavioral disturbance: Secondary | ICD-10-CM | POA: Diagnosis not present

## 2020-07-04 DIAGNOSIS — Z299 Encounter for prophylactic measures, unspecified: Secondary | ICD-10-CM | POA: Diagnosis not present

## 2020-08-21 DIAGNOSIS — L110 Acquired keratosis follicularis: Secondary | ICD-10-CM | POA: Diagnosis not present

## 2020-08-21 DIAGNOSIS — M79672 Pain in left foot: Secondary | ICD-10-CM | POA: Diagnosis not present

## 2020-08-21 DIAGNOSIS — M79671 Pain in right foot: Secondary | ICD-10-CM | POA: Diagnosis not present

## 2020-08-21 DIAGNOSIS — B351 Tinea unguium: Secondary | ICD-10-CM | POA: Diagnosis not present

## 2020-08-21 DIAGNOSIS — I739 Peripheral vascular disease, unspecified: Secondary | ICD-10-CM | POA: Diagnosis not present

## 2020-08-26 DIAGNOSIS — J209 Acute bronchitis, unspecified: Secondary | ICD-10-CM | POA: Diagnosis not present

## 2020-08-26 DIAGNOSIS — Z299 Encounter for prophylactic measures, unspecified: Secondary | ICD-10-CM | POA: Diagnosis not present

## 2020-08-26 DIAGNOSIS — D692 Other nonthrombocytopenic purpura: Secondary | ICD-10-CM | POA: Diagnosis not present

## 2020-08-27 DIAGNOSIS — H52221 Regular astigmatism, right eye: Secondary | ICD-10-CM | POA: Diagnosis not present

## 2020-08-27 DIAGNOSIS — H04123 Dry eye syndrome of bilateral lacrimal glands: Secondary | ICD-10-CM | POA: Diagnosis not present

## 2020-08-27 DIAGNOSIS — Z961 Presence of intraocular lens: Secondary | ICD-10-CM | POA: Diagnosis not present

## 2020-08-27 DIAGNOSIS — D3131 Benign neoplasm of right choroid: Secondary | ICD-10-CM | POA: Diagnosis not present

## 2020-09-12 DIAGNOSIS — L509 Urticaria, unspecified: Secondary | ICD-10-CM | POA: Diagnosis not present

## 2020-09-12 DIAGNOSIS — Z299 Encounter for prophylactic measures, unspecified: Secondary | ICD-10-CM | POA: Diagnosis not present

## 2020-09-12 DIAGNOSIS — F0390 Unspecified dementia without behavioral disturbance: Secondary | ICD-10-CM | POA: Diagnosis not present

## 2020-09-12 DIAGNOSIS — E039 Hypothyroidism, unspecified: Secondary | ICD-10-CM | POA: Diagnosis not present

## 2020-09-12 DIAGNOSIS — R21 Rash and other nonspecific skin eruption: Secondary | ICD-10-CM | POA: Diagnosis not present

## 2020-09-26 DIAGNOSIS — Z87891 Personal history of nicotine dependence: Secondary | ICD-10-CM | POA: Diagnosis not present

## 2020-09-26 DIAGNOSIS — Z299 Encounter for prophylactic measures, unspecified: Secondary | ICD-10-CM | POA: Diagnosis not present

## 2020-10-03 DIAGNOSIS — G20 Parkinson's disease: Secondary | ICD-10-CM | POA: Diagnosis not present

## 2020-10-03 DIAGNOSIS — F0390 Unspecified dementia without behavioral disturbance: Secondary | ICD-10-CM | POA: Diagnosis not present

## 2020-10-03 DIAGNOSIS — Z299 Encounter for prophylactic measures, unspecified: Secondary | ICD-10-CM | POA: Diagnosis not present

## 2020-10-03 DIAGNOSIS — Z789 Other specified health status: Secondary | ICD-10-CM | POA: Diagnosis not present

## 2020-10-03 DIAGNOSIS — E039 Hypothyroidism, unspecified: Secondary | ICD-10-CM | POA: Diagnosis not present

## 2020-10-08 DIAGNOSIS — Z299 Encounter for prophylactic measures, unspecified: Secondary | ICD-10-CM | POA: Diagnosis not present

## 2020-10-08 DIAGNOSIS — F0390 Unspecified dementia without behavioral disturbance: Secondary | ICD-10-CM | POA: Diagnosis not present

## 2020-10-08 DIAGNOSIS — G20 Parkinson's disease: Secondary | ICD-10-CM | POA: Diagnosis not present

## 2020-11-05 DIAGNOSIS — F0390 Unspecified dementia without behavioral disturbance: Secondary | ICD-10-CM | POA: Diagnosis not present

## 2020-11-05 DIAGNOSIS — G20 Parkinson's disease: Secondary | ICD-10-CM | POA: Diagnosis not present

## 2020-11-05 DIAGNOSIS — E039 Hypothyroidism, unspecified: Secondary | ICD-10-CM | POA: Diagnosis not present

## 2020-11-05 DIAGNOSIS — Z299 Encounter for prophylactic measures, unspecified: Secondary | ICD-10-CM | POA: Diagnosis not present

## 2020-11-13 DIAGNOSIS — M79672 Pain in left foot: Secondary | ICD-10-CM | POA: Diagnosis not present

## 2020-11-13 DIAGNOSIS — B351 Tinea unguium: Secondary | ICD-10-CM | POA: Diagnosis not present

## 2020-11-13 DIAGNOSIS — L110 Acquired keratosis follicularis: Secondary | ICD-10-CM | POA: Diagnosis not present

## 2020-11-13 DIAGNOSIS — M79671 Pain in right foot: Secondary | ICD-10-CM | POA: Diagnosis not present

## 2020-11-13 DIAGNOSIS — I739 Peripheral vascular disease, unspecified: Secondary | ICD-10-CM | POA: Diagnosis not present

## 2021-01-15 DIAGNOSIS — M79672 Pain in left foot: Secondary | ICD-10-CM | POA: Diagnosis not present

## 2021-01-15 DIAGNOSIS — M79671 Pain in right foot: Secondary | ICD-10-CM | POA: Diagnosis not present

## 2021-01-15 DIAGNOSIS — B351 Tinea unguium: Secondary | ICD-10-CM | POA: Diagnosis not present

## 2021-01-15 DIAGNOSIS — L110 Acquired keratosis follicularis: Secondary | ICD-10-CM | POA: Diagnosis not present

## 2021-01-15 DIAGNOSIS — I739 Peripheral vascular disease, unspecified: Secondary | ICD-10-CM | POA: Diagnosis not present

## 2021-02-10 DIAGNOSIS — Z87891 Personal history of nicotine dependence: Secondary | ICD-10-CM | POA: Diagnosis not present

## 2021-02-10 DIAGNOSIS — Z6822 Body mass index (BMI) 22.0-22.9, adult: Secondary | ICD-10-CM | POA: Diagnosis not present

## 2021-02-10 DIAGNOSIS — Z7189 Other specified counseling: Secondary | ICD-10-CM | POA: Diagnosis not present

## 2021-02-10 DIAGNOSIS — E7800 Pure hypercholesterolemia, unspecified: Secondary | ICD-10-CM | POA: Diagnosis not present

## 2021-02-10 DIAGNOSIS — E039 Hypothyroidism, unspecified: Secondary | ICD-10-CM | POA: Diagnosis not present

## 2021-02-10 DIAGNOSIS — Z299 Encounter for prophylactic measures, unspecified: Secondary | ICD-10-CM | POA: Diagnosis not present

## 2021-02-10 DIAGNOSIS — R5383 Other fatigue: Secondary | ICD-10-CM | POA: Diagnosis not present

## 2021-02-10 DIAGNOSIS — Z125 Encounter for screening for malignant neoplasm of prostate: Secondary | ICD-10-CM | POA: Diagnosis not present

## 2021-02-10 DIAGNOSIS — Z1331 Encounter for screening for depression: Secondary | ICD-10-CM | POA: Diagnosis not present

## 2021-02-10 DIAGNOSIS — Z79899 Other long term (current) drug therapy: Secondary | ICD-10-CM | POA: Diagnosis not present

## 2021-02-10 DIAGNOSIS — Z1339 Encounter for screening examination for other mental health and behavioral disorders: Secondary | ICD-10-CM | POA: Diagnosis not present

## 2021-02-10 DIAGNOSIS — Z0000 Encounter for general adult medical examination without abnormal findings: Secondary | ICD-10-CM | POA: Diagnosis not present

## 2021-02-24 DIAGNOSIS — R0600 Dyspnea, unspecified: Secondary | ICD-10-CM | POA: Diagnosis not present

## 2021-02-24 DIAGNOSIS — Z6822 Body mass index (BMI) 22.0-22.9, adult: Secondary | ICD-10-CM | POA: Diagnosis not present

## 2021-02-24 DIAGNOSIS — J209 Acute bronchitis, unspecified: Secondary | ICD-10-CM | POA: Diagnosis not present

## 2021-02-24 DIAGNOSIS — J069 Acute upper respiratory infection, unspecified: Secondary | ICD-10-CM | POA: Diagnosis not present

## 2021-02-24 DIAGNOSIS — Z299 Encounter for prophylactic measures, unspecified: Secondary | ICD-10-CM | POA: Diagnosis not present

## 2021-04-15 DIAGNOSIS — G20 Parkinson's disease: Secondary | ICD-10-CM | POA: Diagnosis not present

## 2021-04-15 DIAGNOSIS — Z6822 Body mass index (BMI) 22.0-22.9, adult: Secondary | ICD-10-CM | POA: Diagnosis not present

## 2021-04-15 DIAGNOSIS — F0390 Unspecified dementia without behavioral disturbance: Secondary | ICD-10-CM | POA: Diagnosis not present

## 2021-04-15 DIAGNOSIS — D692 Other nonthrombocytopenic purpura: Secondary | ICD-10-CM | POA: Diagnosis not present

## 2021-04-15 DIAGNOSIS — J309 Allergic rhinitis, unspecified: Secondary | ICD-10-CM | POA: Diagnosis not present

## 2021-04-15 DIAGNOSIS — Z299 Encounter for prophylactic measures, unspecified: Secondary | ICD-10-CM | POA: Diagnosis not present

## 2021-04-30 DIAGNOSIS — B351 Tinea unguium: Secondary | ICD-10-CM | POA: Diagnosis not present

## 2021-04-30 DIAGNOSIS — L84 Corns and callosities: Secondary | ICD-10-CM | POA: Diagnosis not present

## 2021-04-30 DIAGNOSIS — M79676 Pain in unspecified toe(s): Secondary | ICD-10-CM | POA: Diagnosis not present

## 2021-04-30 DIAGNOSIS — I70203 Unspecified atherosclerosis of native arteries of extremities, bilateral legs: Secondary | ICD-10-CM | POA: Diagnosis not present

## 2021-07-17 DIAGNOSIS — Z299 Encounter for prophylactic measures, unspecified: Secondary | ICD-10-CM | POA: Diagnosis not present

## 2021-07-17 DIAGNOSIS — R319 Hematuria, unspecified: Secondary | ICD-10-CM | POA: Diagnosis not present

## 2021-07-17 DIAGNOSIS — F0390 Unspecified dementia without behavioral disturbance: Secondary | ICD-10-CM | POA: Diagnosis not present

## 2021-07-17 DIAGNOSIS — G20 Parkinson's disease: Secondary | ICD-10-CM | POA: Diagnosis not present

## 2021-08-06 DIAGNOSIS — B351 Tinea unguium: Secondary | ICD-10-CM | POA: Diagnosis not present

## 2021-08-06 DIAGNOSIS — M79676 Pain in unspecified toe(s): Secondary | ICD-10-CM | POA: Diagnosis not present

## 2021-08-06 DIAGNOSIS — L84 Corns and callosities: Secondary | ICD-10-CM | POA: Diagnosis not present

## 2021-08-06 DIAGNOSIS — I70203 Unspecified atherosclerosis of native arteries of extremities, bilateral legs: Secondary | ICD-10-CM | POA: Diagnosis not present

## 2021-08-18 DIAGNOSIS — R310 Gross hematuria: Secondary | ICD-10-CM | POA: Diagnosis not present

## 2021-08-18 DIAGNOSIS — N401 Enlarged prostate with lower urinary tract symptoms: Secondary | ICD-10-CM | POA: Diagnosis not present

## 2021-08-18 DIAGNOSIS — Z125 Encounter for screening for malignant neoplasm of prostate: Secondary | ICD-10-CM | POA: Diagnosis not present

## 2021-09-10 DIAGNOSIS — H52221 Regular astigmatism, right eye: Secondary | ICD-10-CM | POA: Diagnosis not present

## 2021-09-10 DIAGNOSIS — Z135 Encounter for screening for eye and ear disorders: Secondary | ICD-10-CM | POA: Diagnosis not present

## 2021-09-10 DIAGNOSIS — D3131 Benign neoplasm of right choroid: Secondary | ICD-10-CM | POA: Diagnosis not present

## 2021-09-10 DIAGNOSIS — Z961 Presence of intraocular lens: Secondary | ICD-10-CM | POA: Diagnosis not present

## 2021-09-10 DIAGNOSIS — H04123 Dry eye syndrome of bilateral lacrimal glands: Secondary | ICD-10-CM | POA: Diagnosis not present

## 2021-10-14 DIAGNOSIS — Z299 Encounter for prophylactic measures, unspecified: Secondary | ICD-10-CM | POA: Diagnosis not present

## 2021-10-14 DIAGNOSIS — I10 Essential (primary) hypertension: Secondary | ICD-10-CM | POA: Diagnosis not present

## 2021-10-14 DIAGNOSIS — Z6822 Body mass index (BMI) 22.0-22.9, adult: Secondary | ICD-10-CM | POA: Diagnosis not present

## 2021-10-14 DIAGNOSIS — Z7189 Other specified counseling: Secondary | ICD-10-CM | POA: Diagnosis not present

## 2021-10-14 DIAGNOSIS — E7800 Pure hypercholesterolemia, unspecified: Secondary | ICD-10-CM | POA: Diagnosis not present

## 2021-10-14 DIAGNOSIS — Z1211 Encounter for screening for malignant neoplasm of colon: Secondary | ICD-10-CM | POA: Diagnosis not present

## 2021-10-14 DIAGNOSIS — Z0000 Encounter for general adult medical examination without abnormal findings: Secondary | ICD-10-CM | POA: Diagnosis not present

## 2021-10-14 DIAGNOSIS — Z1331 Encounter for screening for depression: Secondary | ICD-10-CM | POA: Diagnosis not present

## 2021-10-14 DIAGNOSIS — Z1339 Encounter for screening examination for other mental health and behavioral disorders: Secondary | ICD-10-CM | POA: Diagnosis not present

## 2021-10-20 DIAGNOSIS — Z299 Encounter for prophylactic measures, unspecified: Secondary | ICD-10-CM | POA: Diagnosis not present

## 2021-10-20 DIAGNOSIS — R059 Cough, unspecified: Secondary | ICD-10-CM | POA: Diagnosis not present

## 2021-10-20 DIAGNOSIS — J209 Acute bronchitis, unspecified: Secondary | ICD-10-CM | POA: Diagnosis not present

## 2021-10-20 DIAGNOSIS — F0390 Unspecified dementia without behavioral disturbance: Secondary | ICD-10-CM | POA: Diagnosis not present

## 2021-10-20 DIAGNOSIS — I10 Essential (primary) hypertension: Secondary | ICD-10-CM | POA: Diagnosis not present

## 2021-10-20 DIAGNOSIS — G20 Parkinson's disease: Secondary | ICD-10-CM | POA: Diagnosis not present

## 2021-10-20 DIAGNOSIS — Z789 Other specified health status: Secondary | ICD-10-CM | POA: Diagnosis not present

## 2021-10-20 DIAGNOSIS — R918 Other nonspecific abnormal finding of lung field: Secondary | ICD-10-CM | POA: Diagnosis not present

## 2021-10-27 DIAGNOSIS — F0390 Unspecified dementia without behavioral disturbance: Secondary | ICD-10-CM | POA: Diagnosis not present

## 2021-10-27 DIAGNOSIS — J440 Chronic obstructive pulmonary disease with acute lower respiratory infection: Secondary | ICD-10-CM | POA: Diagnosis not present

## 2021-10-27 DIAGNOSIS — R600 Localized edema: Secondary | ICD-10-CM | POA: Diagnosis not present

## 2021-10-27 DIAGNOSIS — G20 Parkinson's disease: Secondary | ICD-10-CM | POA: Diagnosis not present

## 2021-10-27 DIAGNOSIS — Z87891 Personal history of nicotine dependence: Secondary | ICD-10-CM | POA: Diagnosis not present

## 2021-10-27 DIAGNOSIS — R5383 Other fatigue: Secondary | ICD-10-CM | POA: Diagnosis not present

## 2021-10-27 DIAGNOSIS — Z79899 Other long term (current) drug therapy: Secondary | ICD-10-CM | POA: Diagnosis not present

## 2021-10-27 DIAGNOSIS — Z299 Encounter for prophylactic measures, unspecified: Secondary | ICD-10-CM | POA: Diagnosis not present

## 2021-10-27 DIAGNOSIS — Z6825 Body mass index (BMI) 25.0-25.9, adult: Secondary | ICD-10-CM | POA: Diagnosis not present

## 2021-11-03 DIAGNOSIS — Z79899 Other long term (current) drug therapy: Secondary | ICD-10-CM | POA: Diagnosis not present

## 2021-11-03 DIAGNOSIS — Z125 Encounter for screening for malignant neoplasm of prostate: Secondary | ICD-10-CM | POA: Diagnosis not present

## 2021-11-03 DIAGNOSIS — E7800 Pure hypercholesterolemia, unspecified: Secondary | ICD-10-CM | POA: Diagnosis not present

## 2021-11-03 DIAGNOSIS — R600 Localized edema: Secondary | ICD-10-CM | POA: Diagnosis not present

## 2021-11-04 IMAGING — CT CT 3D INDEPENDENT WKST
3 of 6 series · 11 of 34 positions shown, 12 images · non-contrast
Comparison: Radiographs 04/12/2019

CLINICAL DATA: Intertrochanteric fracture of the right hip. Assess
femoral alignment and anteversion.

EXAM:
CT OF THE LOWER RIGHT EXTREMITY WITHOUT CONTRAST, CT OF THE LEFT
LOWER EXTREMITY WITHOUT CONTRAST.
TECHNIQUE: Multidetector CT imaging of the right lower extremity was performed
according to the standard protocol.

[Series 8: lfov lower extremity 2.00 br40 s3 soft · axial · 0.54mm/px · z∈[+1033,+1465]mm · 4 of 312 slices shown, 5 images]
[im 48/312  soft-tissue]
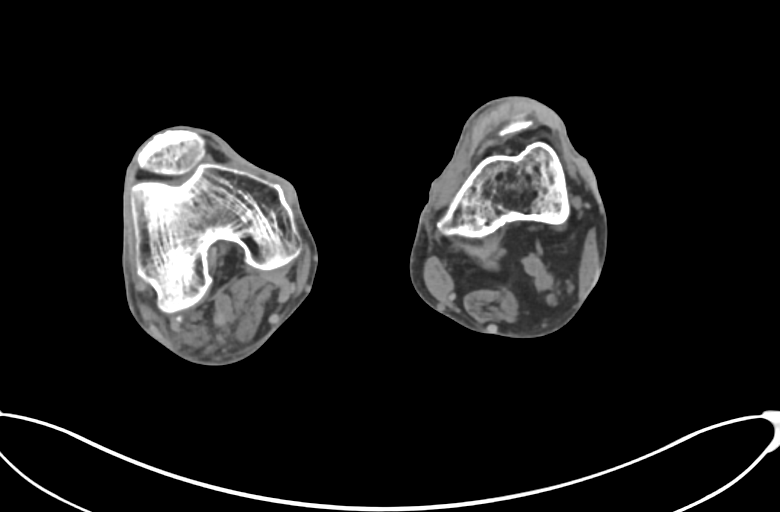
[im 48/312  bone]
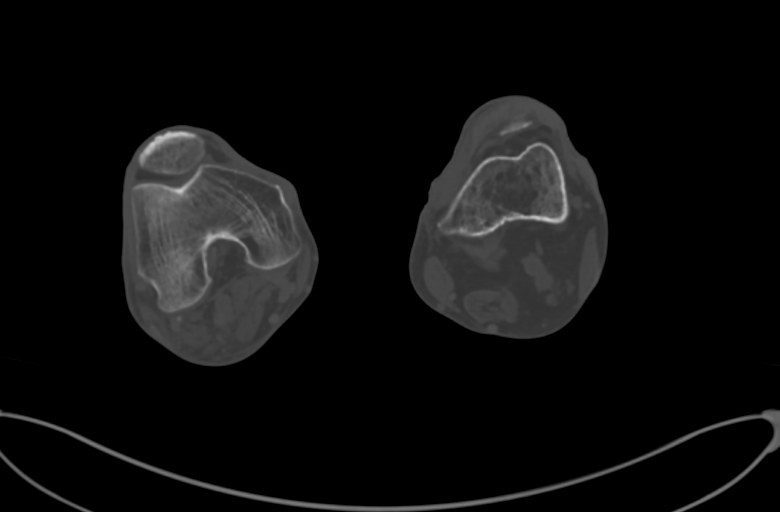
[im 120/312  bone]
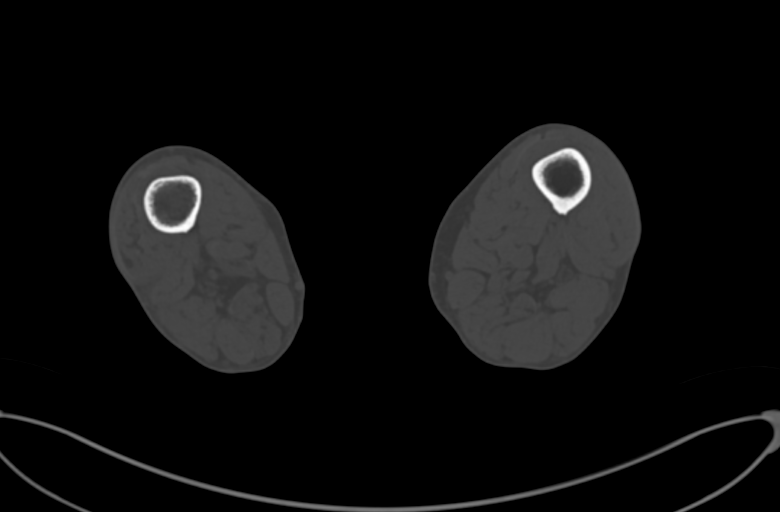
[im 192/312  bone]
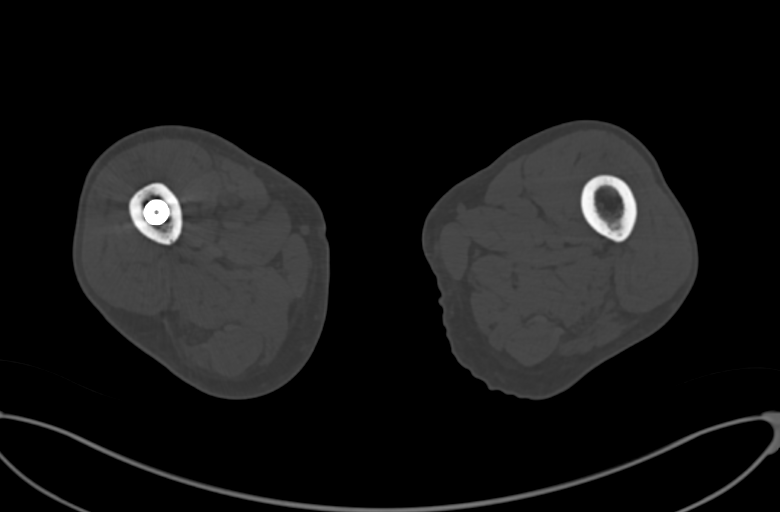
[im 264/312  bone]
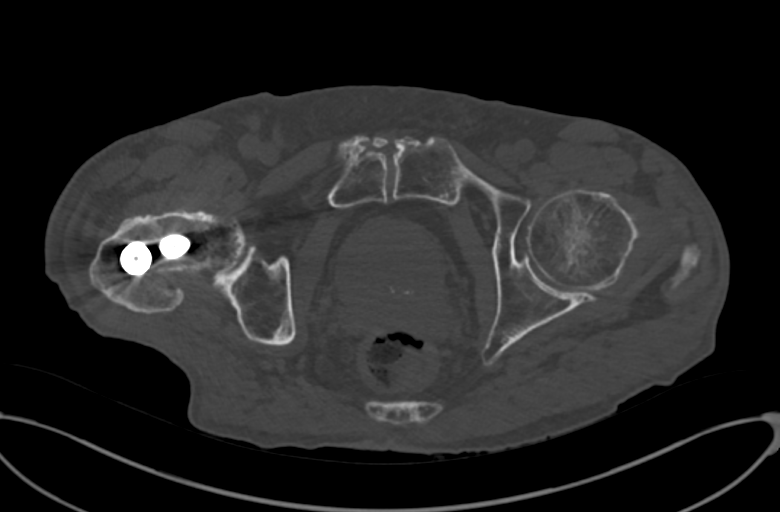

[Series 10: lfov lower extremity 2.00 br60 s3 cor bilat femur · coronal · 0.83mm/px · 1 of 211 slices shown]
[im 106/211  bone]
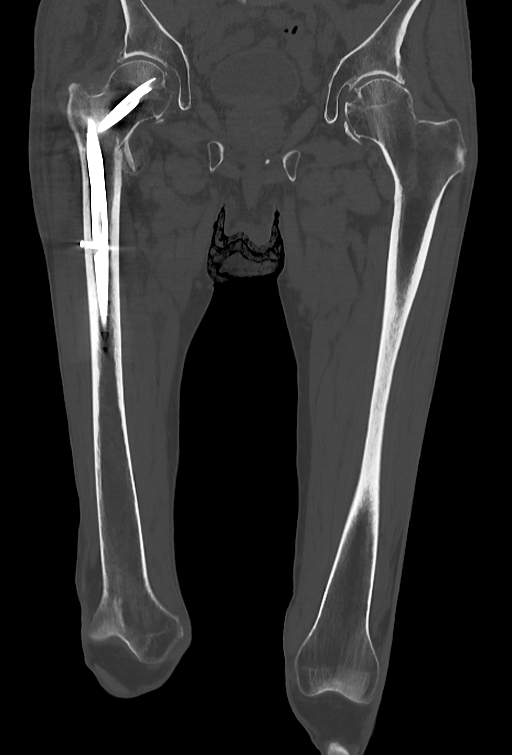

[Series 26: lfov lower extremity 2.00 br40 s3 sag left femur s · sagittal · 0.83mm/px · 6 of 101 slices shown]
[im 17/101  bone]
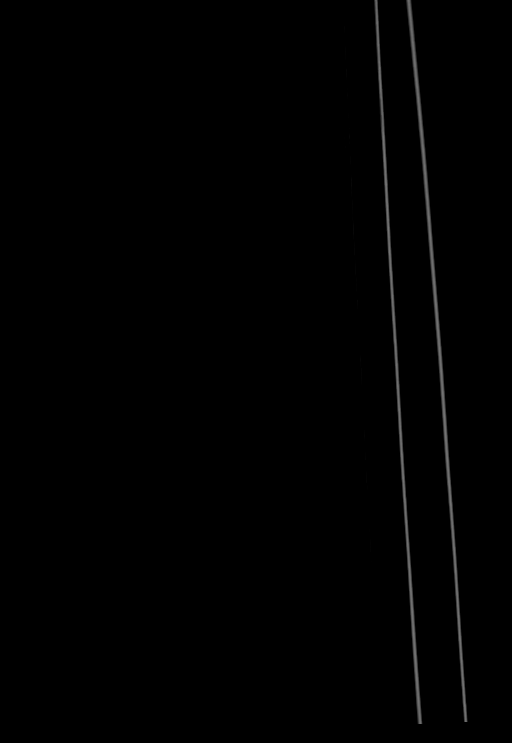
[im 34/101  bone]
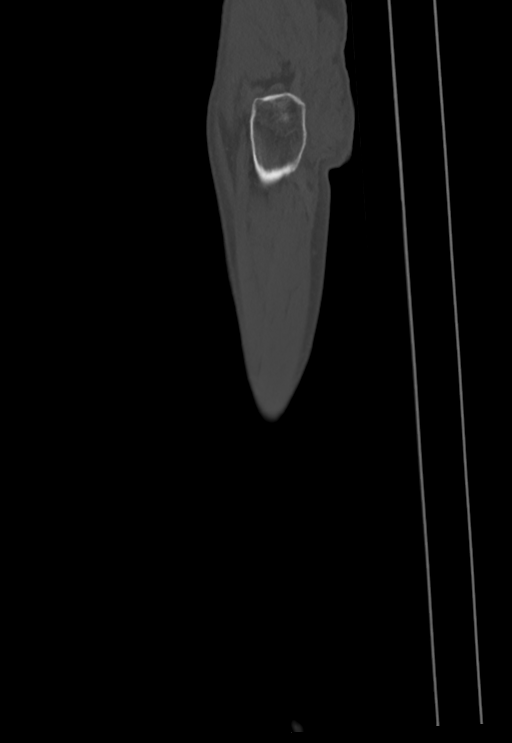
[im 51/101  bone]
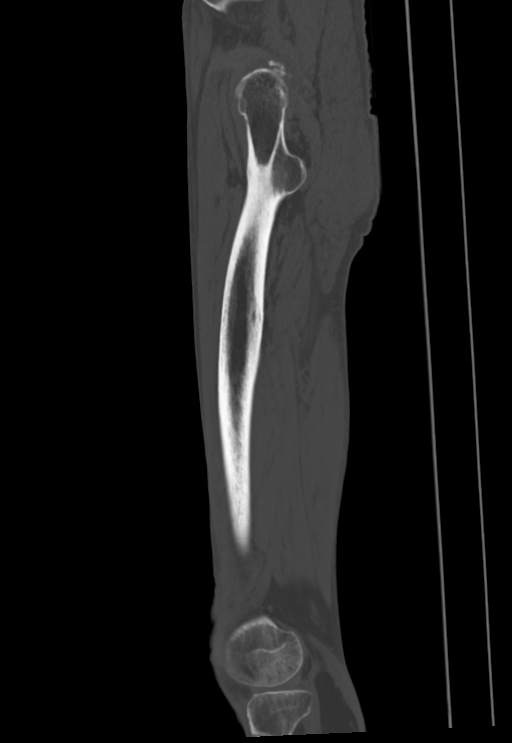
[im 67/101  bone]
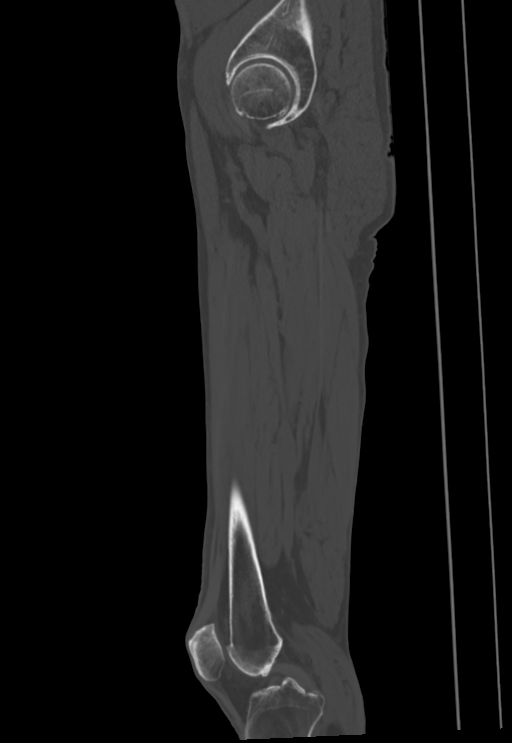
[im 74/101  soft-tissue]
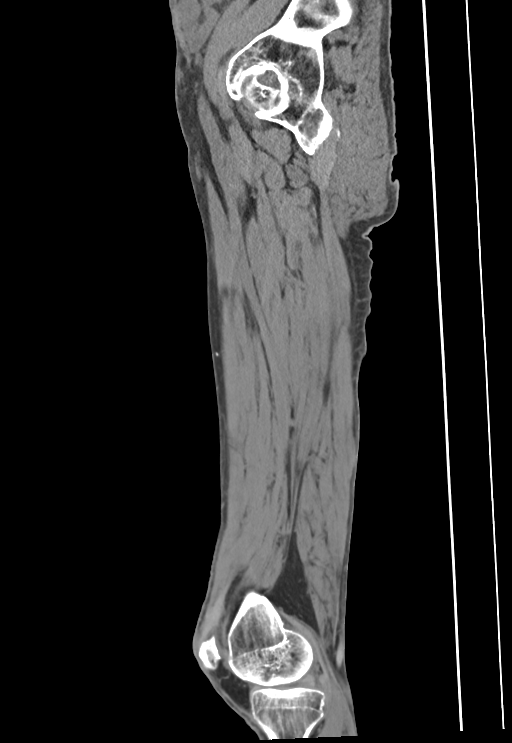
[im 84/101  bone]
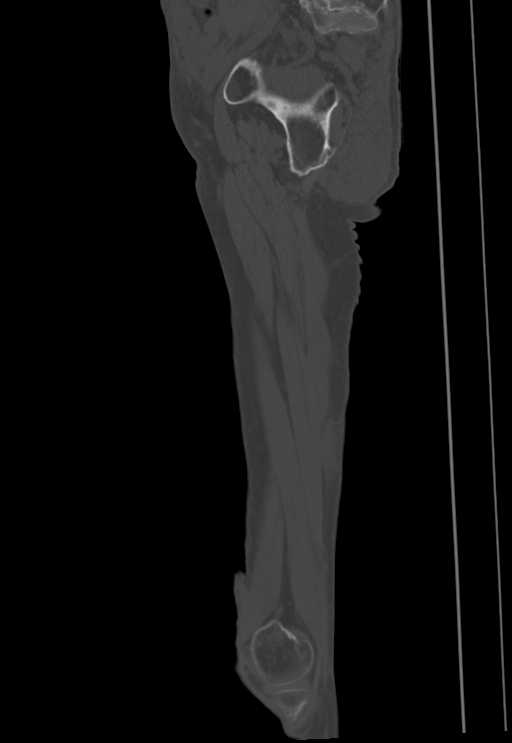

[11 of 34 positions shown; findings below may reference images not displayed]

FINDINGS: Bones/Joint/Cartilage

Intramedullary gamma nail noted in the right femur with a proximal
dynamic hip screw and a single distal interlocking screw. The
fracture is healed. No complicating features are identified with the
hardware.

Both hips are normally located. Moderate to advanced degenerative
changes bilaterally with joint space narrowing, osteophytic spurring
and early subchondral cystic change. No stress fracture or AVN.

The pubic symphysis and SI joints are intact. No pelvic fractures or
bone lesions. Moderate osteoporosis.

No significant intrapelvic abnormalities. Moderate prostate gland
enlargement impressing on the base of the bladder.

The right femur measures 50 cm and the left femur measures 51.2 cm.

The right femoral neck horizontal angle is 13 degrees. The femoral
condyle horizontal angle is 25 degrees of external rotation. Femoral
anteversion angle is -12 degrees.

The left femoral neck horizontal angle is 9 degrees. The femoral
condyle horizontal angle is 11 degrees of internal rotation. Femoral
anteversion angle is 20 degrees.

The knee joints are fairly well maintained. Mild degenerative
changes. No joint effusions. Benign-appearing intermuscular lipoma
noted in the left gastroc muscles.
IMPRESSION: 1. Moderate to advanced bilateral hip joint degenerative changes but
no stress fracture or AVN.
2. Intramedullary gamma nail noted in the right femur with a
proximal dynamic hip screw and a single distal interlocking screw.
The fracture is healed. No complicating features associated with the
hardware.
3. Femoral length and anteversion measures as above.
4. Moderate prostate gland enlargement impressing on the base of the
bladder.

## 2021-11-04 IMAGING — CT CT FEMUR *R* W/O CM
3 of 6 series · 11 of 34 positions shown, 12 images · non-contrast
Comparison: Radiographs 04/12/2019

CLINICAL DATA: Intertrochanteric fracture of the right hip. Assess
femoral alignment and anteversion.

EXAM:
CT OF THE LOWER RIGHT EXTREMITY WITHOUT CONTRAST, CT OF THE LEFT
LOWER EXTREMITY WITHOUT CONTRAST.
TECHNIQUE: Multidetector CT imaging of the right lower extremity was performed
according to the standard protocol.

[Series 8: lfov lower extremity 2.00 br40 s3 soft · axial · 0.54mm/px · z∈[+1033,+1465]mm · 4 of 312 slices shown, 5 images]
[im 48/312  soft-tissue]
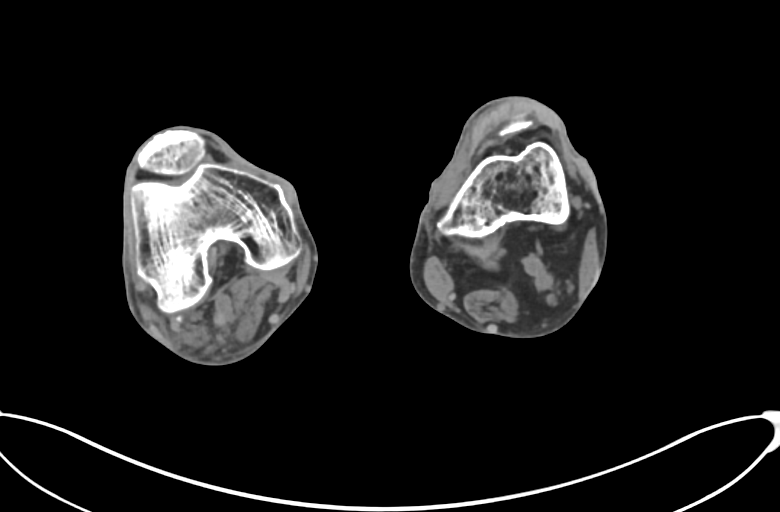
[im 48/312  bone]
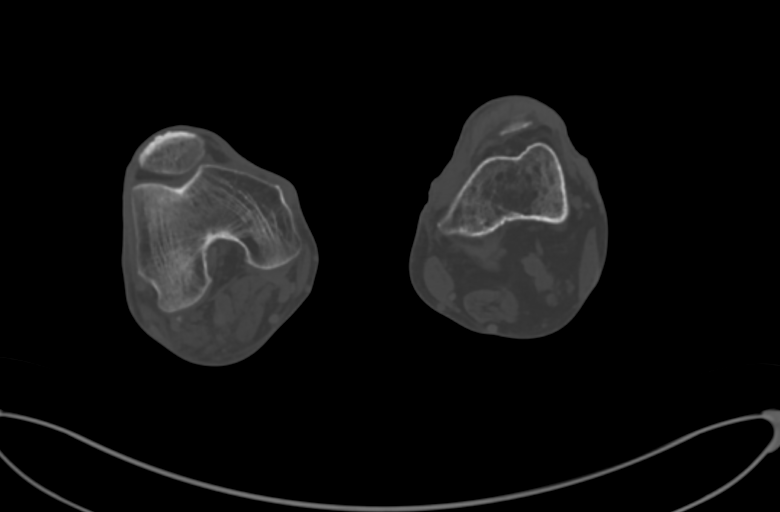
[im 120/312  bone]
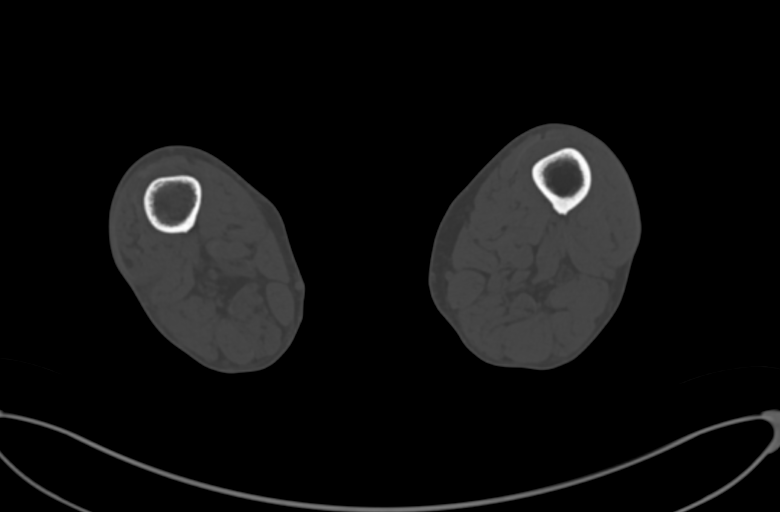
[im 192/312  bone]
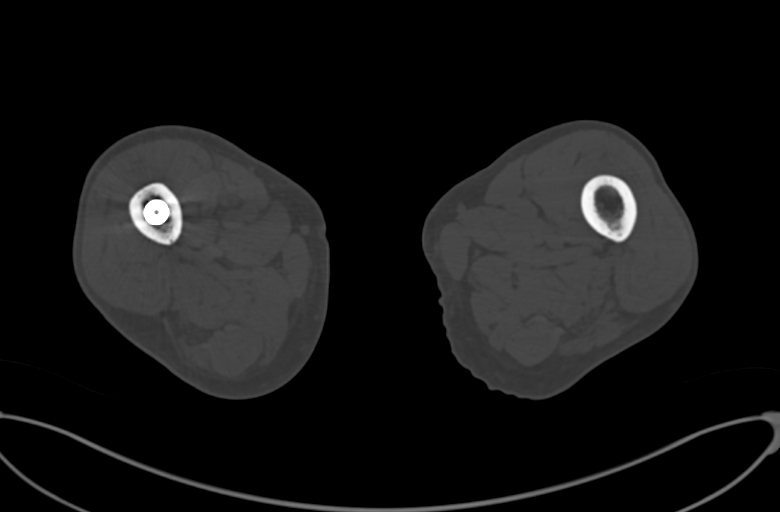
[im 264/312  bone]
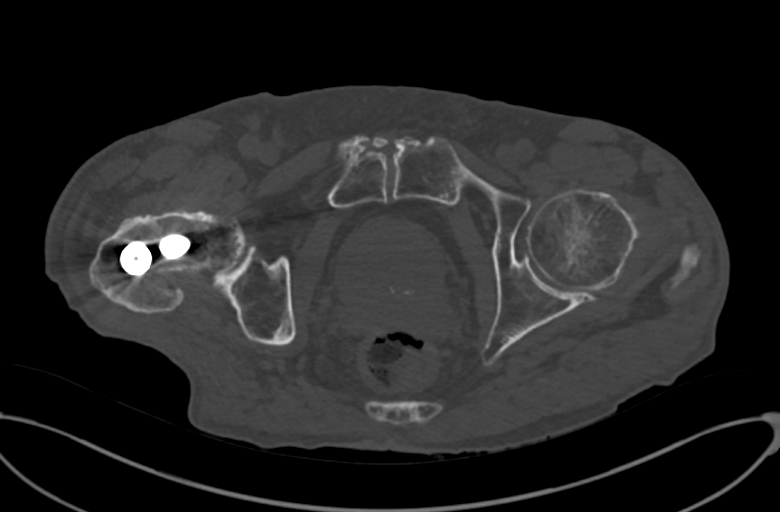

[Series 10: lfov lower extremity 2.00 br60 s3 cor bilat femur · coronal · 0.83mm/px · 1 of 211 slices shown]
[im 106/211  bone]
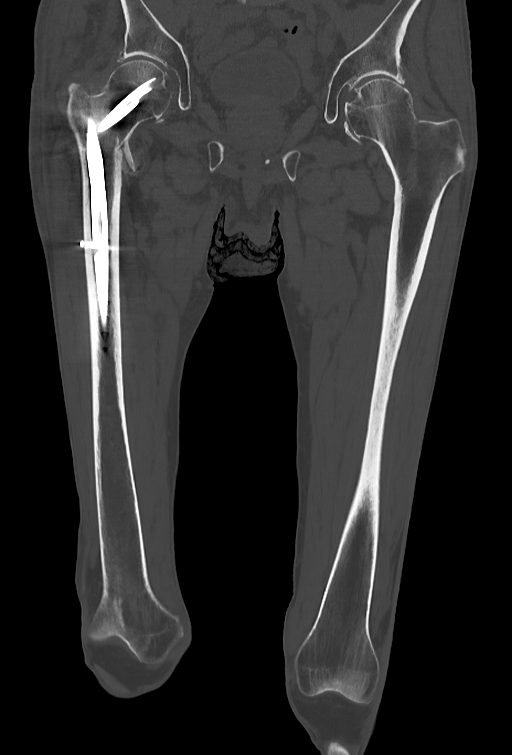

[Series 26: lfov lower extremity 2.00 br40 s3 sag left femur s · sagittal · 0.83mm/px · 6 of 101 slices shown]
[im 17/101  bone]
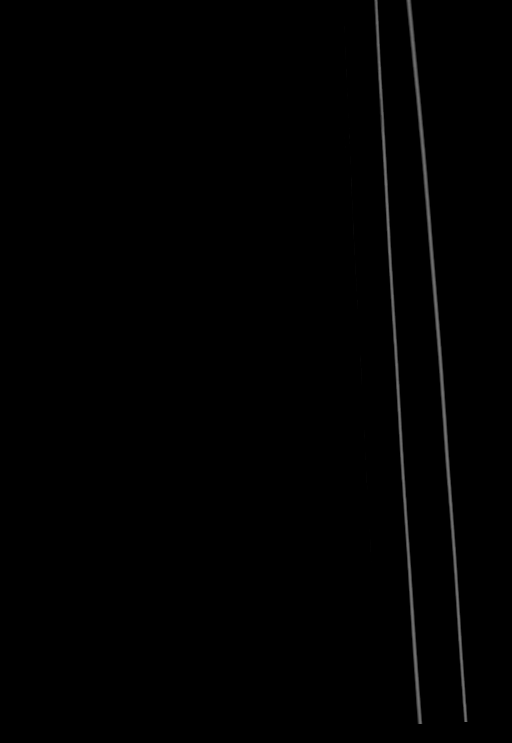
[im 34/101  bone]
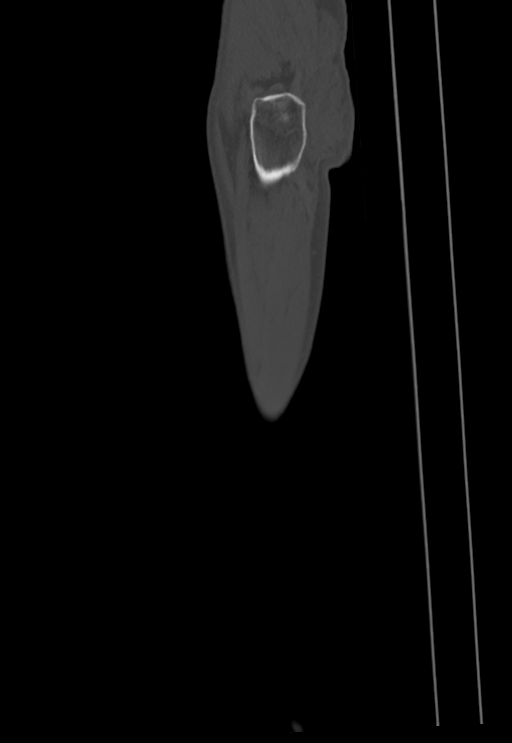
[im 51/101  bone]
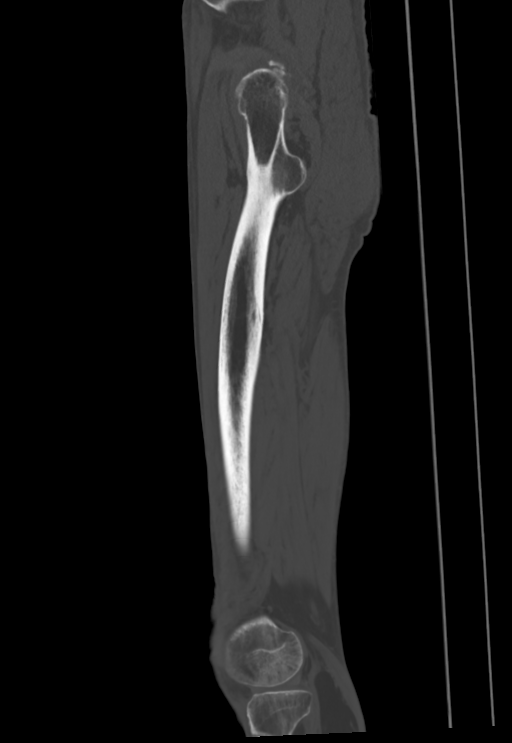
[im 67/101  bone]
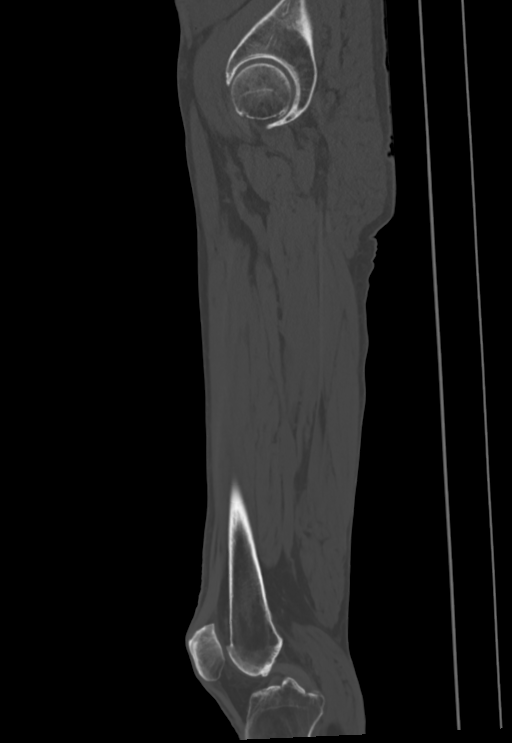
[im 74/101  soft-tissue]
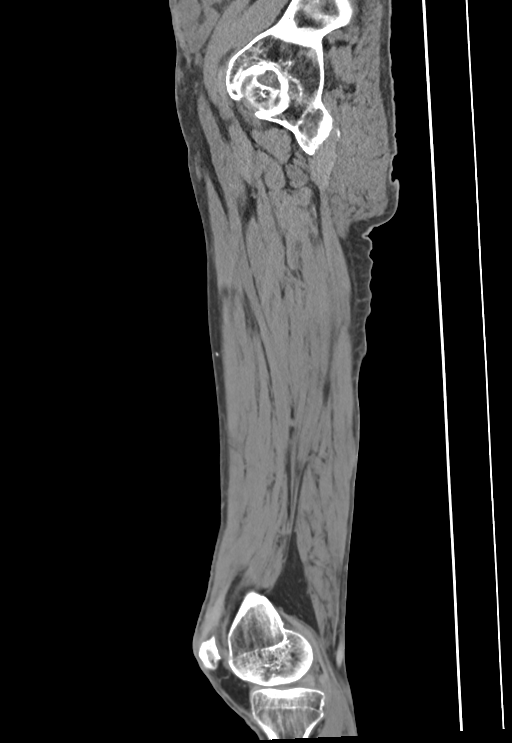
[im 84/101  bone]
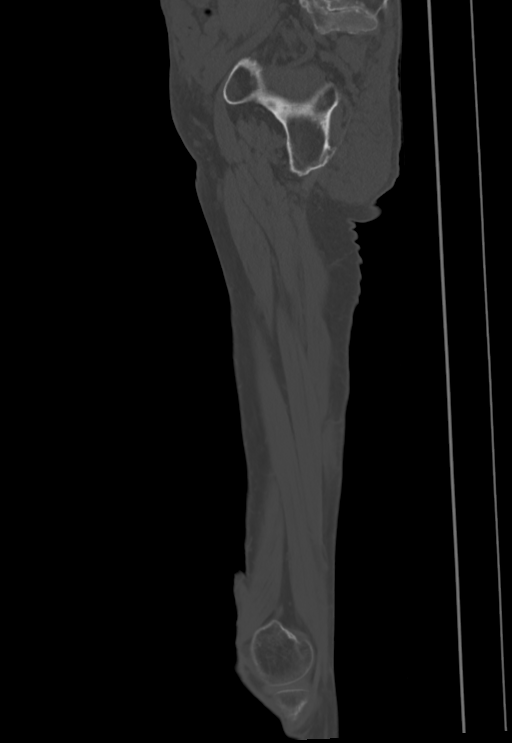

[11 of 34 positions shown; findings below may reference images not displayed]

FINDINGS: Bones/Joint/Cartilage

Intramedullary gamma nail noted in the right femur with a proximal
dynamic hip screw and a single distal interlocking screw. The
fracture is healed. No complicating features are identified with the
hardware.

Both hips are normally located. Moderate to advanced degenerative
changes bilaterally with joint space narrowing, osteophytic spurring
and early subchondral cystic change. No stress fracture or AVN.

The pubic symphysis and SI joints are intact. No pelvic fractures or
bone lesions. Moderate osteoporosis.

No significant intrapelvic abnormalities. Moderate prostate gland
enlargement impressing on the base of the bladder.

The right femur measures 50 cm and the left femur measures 51.2 cm.

The right femoral neck horizontal angle is 13 degrees. The femoral
condyle horizontal angle is 25 degrees of external rotation. Femoral
anteversion angle is -12 degrees.

The left femoral neck horizontal angle is 9 degrees. The femoral
condyle horizontal angle is 11 degrees of internal rotation. Femoral
anteversion angle is 20 degrees.

The knee joints are fairly well maintained. Mild degenerative
changes. No joint effusions. Benign-appearing intermuscular lipoma
noted in the left gastroc muscles.
IMPRESSION: 1. Moderate to advanced bilateral hip joint degenerative changes but
no stress fracture or AVN.
2. Intramedullary gamma nail noted in the right femur with a
proximal dynamic hip screw and a single distal interlocking screw.
The fracture is healed. No complicating features associated with the
hardware.
3. Femoral length and anteversion measures as above.
4. Moderate prostate gland enlargement impressing on the base of the
bladder.

## 2021-11-14 DIAGNOSIS — I5032 Chronic diastolic (congestive) heart failure: Secondary | ICD-10-CM | POA: Diagnosis not present

## 2021-11-14 DIAGNOSIS — Z23 Encounter for immunization: Secondary | ICD-10-CM | POA: Diagnosis not present

## 2021-11-14 DIAGNOSIS — I10 Essential (primary) hypertension: Secondary | ICD-10-CM | POA: Diagnosis not present

## 2021-11-14 DIAGNOSIS — Z789 Other specified health status: Secondary | ICD-10-CM | POA: Diagnosis not present

## 2021-11-14 DIAGNOSIS — Z299 Encounter for prophylactic measures, unspecified: Secondary | ICD-10-CM | POA: Diagnosis not present

## 2021-11-18 DIAGNOSIS — R310 Gross hematuria: Secondary | ICD-10-CM | POA: Diagnosis not present

## 2021-11-18 DIAGNOSIS — N401 Enlarged prostate with lower urinary tract symptoms: Secondary | ICD-10-CM | POA: Diagnosis not present

## 2021-11-18 DIAGNOSIS — Z125 Encounter for screening for malignant neoplasm of prostate: Secondary | ICD-10-CM | POA: Diagnosis not present

## 2021-11-19 DIAGNOSIS — I70203 Unspecified atherosclerosis of native arteries of extremities, bilateral legs: Secondary | ICD-10-CM | POA: Diagnosis not present

## 2021-11-19 DIAGNOSIS — B351 Tinea unguium: Secondary | ICD-10-CM | POA: Diagnosis not present

## 2021-11-19 DIAGNOSIS — M79676 Pain in unspecified toe(s): Secondary | ICD-10-CM | POA: Diagnosis not present

## 2021-11-19 DIAGNOSIS — L84 Corns and callosities: Secondary | ICD-10-CM | POA: Diagnosis not present

## 2022-02-13 DIAGNOSIS — B351 Tinea unguium: Secondary | ICD-10-CM | POA: Diagnosis not present

## 2022-02-13 DIAGNOSIS — M79676 Pain in unspecified toe(s): Secondary | ICD-10-CM | POA: Diagnosis not present

## 2022-02-13 DIAGNOSIS — I70203 Unspecified atherosclerosis of native arteries of extremities, bilateral legs: Secondary | ICD-10-CM | POA: Diagnosis not present

## 2022-02-13 DIAGNOSIS — L84 Corns and callosities: Secondary | ICD-10-CM | POA: Diagnosis not present

## 2022-02-17 DIAGNOSIS — Z87891 Personal history of nicotine dependence: Secondary | ICD-10-CM | POA: Diagnosis not present

## 2022-02-17 DIAGNOSIS — I10 Essential (primary) hypertension: Secondary | ICD-10-CM | POA: Diagnosis not present

## 2022-02-17 DIAGNOSIS — Z299 Encounter for prophylactic measures, unspecified: Secondary | ICD-10-CM | POA: Diagnosis not present

## 2022-02-17 DIAGNOSIS — I5032 Chronic diastolic (congestive) heart failure: Secondary | ICD-10-CM | POA: Diagnosis not present

## 2022-02-17 DIAGNOSIS — Z0000 Encounter for general adult medical examination without abnormal findings: Secondary | ICD-10-CM | POA: Diagnosis not present

## 2022-04-24 DIAGNOSIS — B351 Tinea unguium: Secondary | ICD-10-CM | POA: Diagnosis not present

## 2022-04-24 DIAGNOSIS — M79676 Pain in unspecified toe(s): Secondary | ICD-10-CM | POA: Diagnosis not present

## 2022-05-03 DIAGNOSIS — E039 Hypothyroidism, unspecified: Secondary | ICD-10-CM | POA: Diagnosis not present

## 2022-05-03 DIAGNOSIS — G20A1 Parkinson's disease without dyskinesia, without mention of fluctuations: Secondary | ICD-10-CM | POA: Diagnosis not present

## 2022-05-03 DIAGNOSIS — G20C Parkinsonism, unspecified: Secondary | ICD-10-CM | POA: Diagnosis not present

## 2022-05-03 DIAGNOSIS — R0600 Dyspnea, unspecified: Secondary | ICD-10-CM | POA: Diagnosis not present

## 2022-05-03 DIAGNOSIS — I5021 Acute systolic (congestive) heart failure: Secondary | ICD-10-CM | POA: Diagnosis not present

## 2022-05-03 DIAGNOSIS — J90 Pleural effusion, not elsewhere classified: Secondary | ICD-10-CM | POA: Diagnosis not present

## 2022-05-03 DIAGNOSIS — R0902 Hypoxemia: Secondary | ICD-10-CM | POA: Diagnosis not present

## 2022-05-03 DIAGNOSIS — Z9981 Dependence on supplemental oxygen: Secondary | ICD-10-CM | POA: Diagnosis not present

## 2022-05-03 DIAGNOSIS — R7989 Other specified abnormal findings of blood chemistry: Secondary | ICD-10-CM | POA: Diagnosis not present

## 2022-05-03 DIAGNOSIS — I272 Pulmonary hypertension, unspecified: Secondary | ICD-10-CM | POA: Diagnosis not present

## 2022-05-03 DIAGNOSIS — R54 Age-related physical debility: Secondary | ICD-10-CM | POA: Diagnosis not present

## 2022-05-03 DIAGNOSIS — J189 Pneumonia, unspecified organism: Secondary | ICD-10-CM | POA: Diagnosis not present

## 2022-05-03 DIAGNOSIS — Z792 Long term (current) use of antibiotics: Secondary | ICD-10-CM | POA: Diagnosis not present

## 2022-05-03 DIAGNOSIS — J440 Chronic obstructive pulmonary disease with acute lower respiratory infection: Secondary | ICD-10-CM | POA: Diagnosis not present

## 2022-05-03 DIAGNOSIS — Z1152 Encounter for screening for COVID-19: Secondary | ICD-10-CM | POA: Diagnosis not present

## 2022-05-03 DIAGNOSIS — I340 Nonrheumatic mitral (valve) insufficiency: Secondary | ICD-10-CM | POA: Diagnosis not present

## 2022-05-03 DIAGNOSIS — Z66 Do not resuscitate: Secondary | ICD-10-CM | POA: Diagnosis not present

## 2022-05-03 DIAGNOSIS — I509 Heart failure, unspecified: Secondary | ICD-10-CM | POA: Diagnosis not present

## 2022-05-03 DIAGNOSIS — J811 Chronic pulmonary edema: Secondary | ICD-10-CM | POA: Diagnosis not present

## 2022-05-03 DIAGNOSIS — F0280 Dementia in other diseases classified elsewhere without behavioral disturbance: Secondary | ICD-10-CM | POA: Diagnosis not present

## 2022-05-03 DIAGNOSIS — I5020 Unspecified systolic (congestive) heart failure: Secondary | ICD-10-CM | POA: Diagnosis not present

## 2022-05-03 DIAGNOSIS — F0390 Unspecified dementia without behavioral disturbance: Secondary | ICD-10-CM | POA: Diagnosis not present

## 2022-05-03 DIAGNOSIS — J441 Chronic obstructive pulmonary disease with (acute) exacerbation: Secondary | ICD-10-CM | POA: Diagnosis not present

## 2022-05-03 DIAGNOSIS — Z79899 Other long term (current) drug therapy: Secondary | ICD-10-CM | POA: Diagnosis not present

## 2022-05-08 DIAGNOSIS — I509 Heart failure, unspecified: Secondary | ICD-10-CM | POA: Diagnosis not present

## 2022-05-08 DIAGNOSIS — R609 Edema, unspecified: Secondary | ICD-10-CM | POA: Diagnosis not present

## 2022-05-08 DIAGNOSIS — Z515 Encounter for palliative care: Secondary | ICD-10-CM | POA: Diagnosis not present

## 2022-05-08 DIAGNOSIS — R0600 Dyspnea, unspecified: Secondary | ICD-10-CM | POA: Diagnosis not present

## 2022-05-12 DIAGNOSIS — I5022 Chronic systolic (congestive) heart failure: Secondary | ICD-10-CM | POA: Diagnosis not present

## 2022-05-12 DIAGNOSIS — F0390 Unspecified dementia without behavioral disturbance: Secondary | ICD-10-CM | POA: Diagnosis not present

## 2022-05-12 DIAGNOSIS — J849 Interstitial pulmonary disease, unspecified: Secondary | ICD-10-CM | POA: Diagnosis not present

## 2022-05-12 DIAGNOSIS — I10 Essential (primary) hypertension: Secondary | ICD-10-CM | POA: Diagnosis not present

## 2022-05-12 DIAGNOSIS — Z299 Encounter for prophylactic measures, unspecified: Secondary | ICD-10-CM | POA: Diagnosis not present

## 2022-05-12 DIAGNOSIS — Z09 Encounter for follow-up examination after completed treatment for conditions other than malignant neoplasm: Secondary | ICD-10-CM | POA: Diagnosis not present

## 2022-05-13 DIAGNOSIS — I5020 Unspecified systolic (congestive) heart failure: Secondary | ICD-10-CM | POA: Diagnosis not present

## 2022-05-13 DIAGNOSIS — I340 Nonrheumatic mitral (valve) insufficiency: Secondary | ICD-10-CM | POA: Diagnosis not present

## 2022-05-13 DIAGNOSIS — G20A1 Parkinson's disease without dyskinesia, without mention of fluctuations: Secondary | ICD-10-CM | POA: Diagnosis not present

## 2022-05-13 DIAGNOSIS — J440 Chronic obstructive pulmonary disease with acute lower respiratory infection: Secondary | ICD-10-CM | POA: Diagnosis not present

## 2022-05-13 DIAGNOSIS — J189 Pneumonia, unspecified organism: Secondary | ICD-10-CM | POA: Diagnosis not present

## 2022-05-13 DIAGNOSIS — J441 Chronic obstructive pulmonary disease with (acute) exacerbation: Secondary | ICD-10-CM | POA: Diagnosis not present

## 2022-05-13 DIAGNOSIS — J849 Interstitial pulmonary disease, unspecified: Secondary | ICD-10-CM | POA: Diagnosis not present

## 2022-05-13 DIAGNOSIS — I272 Pulmonary hypertension, unspecified: Secondary | ICD-10-CM | POA: Diagnosis not present

## 2022-05-13 DIAGNOSIS — F02A0 Dementia in other diseases classified elsewhere, mild, without behavioral disturbance, psychotic disturbance, mood disturbance, and anxiety: Secondary | ICD-10-CM | POA: Diagnosis not present

## 2022-05-18 DIAGNOSIS — J441 Chronic obstructive pulmonary disease with (acute) exacerbation: Secondary | ICD-10-CM | POA: Diagnosis not present

## 2022-05-18 DIAGNOSIS — G20A1 Parkinson's disease without dyskinesia, without mention of fluctuations: Secondary | ICD-10-CM | POA: Diagnosis not present

## 2022-05-18 DIAGNOSIS — J189 Pneumonia, unspecified organism: Secondary | ICD-10-CM | POA: Diagnosis not present

## 2022-05-18 DIAGNOSIS — I340 Nonrheumatic mitral (valve) insufficiency: Secondary | ICD-10-CM | POA: Diagnosis not present

## 2022-05-18 DIAGNOSIS — I5020 Unspecified systolic (congestive) heart failure: Secondary | ICD-10-CM | POA: Diagnosis not present

## 2022-05-18 DIAGNOSIS — F02A0 Dementia in other diseases classified elsewhere, mild, without behavioral disturbance, psychotic disturbance, mood disturbance, and anxiety: Secondary | ICD-10-CM | POA: Diagnosis not present

## 2022-05-18 DIAGNOSIS — J440 Chronic obstructive pulmonary disease with acute lower respiratory infection: Secondary | ICD-10-CM | POA: Diagnosis not present

## 2022-05-18 DIAGNOSIS — I272 Pulmonary hypertension, unspecified: Secondary | ICD-10-CM | POA: Diagnosis not present

## 2022-05-18 DIAGNOSIS — J849 Interstitial pulmonary disease, unspecified: Secondary | ICD-10-CM | POA: Diagnosis not present

## 2022-05-19 DIAGNOSIS — I340 Nonrheumatic mitral (valve) insufficiency: Secondary | ICD-10-CM | POA: Diagnosis not present

## 2022-05-19 DIAGNOSIS — F02A0 Dementia in other diseases classified elsewhere, mild, without behavioral disturbance, psychotic disturbance, mood disturbance, and anxiety: Secondary | ICD-10-CM | POA: Diagnosis not present

## 2022-05-19 DIAGNOSIS — J441 Chronic obstructive pulmonary disease with (acute) exacerbation: Secondary | ICD-10-CM | POA: Diagnosis not present

## 2022-05-19 DIAGNOSIS — I5020 Unspecified systolic (congestive) heart failure: Secondary | ICD-10-CM | POA: Diagnosis not present

## 2022-05-19 DIAGNOSIS — G20A1 Parkinson's disease without dyskinesia, without mention of fluctuations: Secondary | ICD-10-CM | POA: Diagnosis not present

## 2022-05-19 DIAGNOSIS — J849 Interstitial pulmonary disease, unspecified: Secondary | ICD-10-CM | POA: Diagnosis not present

## 2022-05-19 DIAGNOSIS — I272 Pulmonary hypertension, unspecified: Secondary | ICD-10-CM | POA: Diagnosis not present

## 2022-05-19 DIAGNOSIS — J189 Pneumonia, unspecified organism: Secondary | ICD-10-CM | POA: Diagnosis not present

## 2022-05-19 DIAGNOSIS — J440 Chronic obstructive pulmonary disease with acute lower respiratory infection: Secondary | ICD-10-CM | POA: Diagnosis not present

## 2022-05-21 DIAGNOSIS — I5020 Unspecified systolic (congestive) heart failure: Secondary | ICD-10-CM | POA: Diagnosis not present

## 2022-05-21 DIAGNOSIS — I340 Nonrheumatic mitral (valve) insufficiency: Secondary | ICD-10-CM | POA: Diagnosis not present

## 2022-05-21 DIAGNOSIS — J441 Chronic obstructive pulmonary disease with (acute) exacerbation: Secondary | ICD-10-CM | POA: Diagnosis not present

## 2022-05-21 DIAGNOSIS — J849 Interstitial pulmonary disease, unspecified: Secondary | ICD-10-CM | POA: Diagnosis not present

## 2022-05-21 DIAGNOSIS — F02A0 Dementia in other diseases classified elsewhere, mild, without behavioral disturbance, psychotic disturbance, mood disturbance, and anxiety: Secondary | ICD-10-CM | POA: Diagnosis not present

## 2022-05-21 DIAGNOSIS — I272 Pulmonary hypertension, unspecified: Secondary | ICD-10-CM | POA: Diagnosis not present

## 2022-05-21 DIAGNOSIS — J189 Pneumonia, unspecified organism: Secondary | ICD-10-CM | POA: Diagnosis not present

## 2022-05-21 DIAGNOSIS — G20A1 Parkinson's disease without dyskinesia, without mention of fluctuations: Secondary | ICD-10-CM | POA: Diagnosis not present

## 2022-05-21 DIAGNOSIS — J440 Chronic obstructive pulmonary disease with acute lower respiratory infection: Secondary | ICD-10-CM | POA: Diagnosis not present

## 2022-05-25 DIAGNOSIS — I340 Nonrheumatic mitral (valve) insufficiency: Secondary | ICD-10-CM | POA: Diagnosis not present

## 2022-05-25 DIAGNOSIS — J441 Chronic obstructive pulmonary disease with (acute) exacerbation: Secondary | ICD-10-CM | POA: Diagnosis not present

## 2022-05-25 DIAGNOSIS — G20A1 Parkinson's disease without dyskinesia, without mention of fluctuations: Secondary | ICD-10-CM | POA: Diagnosis not present

## 2022-05-25 DIAGNOSIS — J189 Pneumonia, unspecified organism: Secondary | ICD-10-CM | POA: Diagnosis not present

## 2022-05-25 DIAGNOSIS — J440 Chronic obstructive pulmonary disease with acute lower respiratory infection: Secondary | ICD-10-CM | POA: Diagnosis not present

## 2022-05-25 DIAGNOSIS — I5020 Unspecified systolic (congestive) heart failure: Secondary | ICD-10-CM | POA: Diagnosis not present

## 2022-05-25 DIAGNOSIS — I272 Pulmonary hypertension, unspecified: Secondary | ICD-10-CM | POA: Diagnosis not present

## 2022-05-25 DIAGNOSIS — J849 Interstitial pulmonary disease, unspecified: Secondary | ICD-10-CM | POA: Diagnosis not present

## 2022-05-25 DIAGNOSIS — F02A0 Dementia in other diseases classified elsewhere, mild, without behavioral disturbance, psychotic disturbance, mood disturbance, and anxiety: Secondary | ICD-10-CM | POA: Diagnosis not present

## 2022-05-27 DIAGNOSIS — J189 Pneumonia, unspecified organism: Secondary | ICD-10-CM | POA: Diagnosis not present

## 2022-05-27 DIAGNOSIS — I5020 Unspecified systolic (congestive) heart failure: Secondary | ICD-10-CM | POA: Diagnosis not present

## 2022-05-27 DIAGNOSIS — J849 Interstitial pulmonary disease, unspecified: Secondary | ICD-10-CM | POA: Diagnosis not present

## 2022-05-27 DIAGNOSIS — J441 Chronic obstructive pulmonary disease with (acute) exacerbation: Secondary | ICD-10-CM | POA: Diagnosis not present

## 2022-05-27 DIAGNOSIS — G20A1 Parkinson's disease without dyskinesia, without mention of fluctuations: Secondary | ICD-10-CM | POA: Diagnosis not present

## 2022-05-27 DIAGNOSIS — I340 Nonrheumatic mitral (valve) insufficiency: Secondary | ICD-10-CM | POA: Diagnosis not present

## 2022-05-27 DIAGNOSIS — I272 Pulmonary hypertension, unspecified: Secondary | ICD-10-CM | POA: Diagnosis not present

## 2022-05-27 DIAGNOSIS — F02A0 Dementia in other diseases classified elsewhere, mild, without behavioral disturbance, psychotic disturbance, mood disturbance, and anxiety: Secondary | ICD-10-CM | POA: Diagnosis not present

## 2022-05-27 DIAGNOSIS — J440 Chronic obstructive pulmonary disease with acute lower respiratory infection: Secondary | ICD-10-CM | POA: Diagnosis not present

## 2022-05-28 DIAGNOSIS — I340 Nonrheumatic mitral (valve) insufficiency: Secondary | ICD-10-CM | POA: Diagnosis not present

## 2022-05-28 DIAGNOSIS — J441 Chronic obstructive pulmonary disease with (acute) exacerbation: Secondary | ICD-10-CM | POA: Diagnosis not present

## 2022-05-28 DIAGNOSIS — J849 Interstitial pulmonary disease, unspecified: Secondary | ICD-10-CM | POA: Diagnosis not present

## 2022-05-28 DIAGNOSIS — J440 Chronic obstructive pulmonary disease with acute lower respiratory infection: Secondary | ICD-10-CM | POA: Diagnosis not present

## 2022-05-28 DIAGNOSIS — G20A1 Parkinson's disease without dyskinesia, without mention of fluctuations: Secondary | ICD-10-CM | POA: Diagnosis not present

## 2022-05-28 DIAGNOSIS — I5020 Unspecified systolic (congestive) heart failure: Secondary | ICD-10-CM | POA: Diagnosis not present

## 2022-05-28 DIAGNOSIS — J189 Pneumonia, unspecified organism: Secondary | ICD-10-CM | POA: Diagnosis not present

## 2022-05-28 DIAGNOSIS — I272 Pulmonary hypertension, unspecified: Secondary | ICD-10-CM | POA: Diagnosis not present

## 2022-05-28 DIAGNOSIS — F02A0 Dementia in other diseases classified elsewhere, mild, without behavioral disturbance, psychotic disturbance, mood disturbance, and anxiety: Secondary | ICD-10-CM | POA: Diagnosis not present

## 2022-06-01 DIAGNOSIS — J441 Chronic obstructive pulmonary disease with (acute) exacerbation: Secondary | ICD-10-CM | POA: Diagnosis not present

## 2022-06-01 DIAGNOSIS — I5020 Unspecified systolic (congestive) heart failure: Secondary | ICD-10-CM | POA: Diagnosis not present

## 2022-06-01 DIAGNOSIS — F02A0 Dementia in other diseases classified elsewhere, mild, without behavioral disturbance, psychotic disturbance, mood disturbance, and anxiety: Secondary | ICD-10-CM | POA: Diagnosis not present

## 2022-06-01 DIAGNOSIS — J849 Interstitial pulmonary disease, unspecified: Secondary | ICD-10-CM | POA: Diagnosis not present

## 2022-06-01 DIAGNOSIS — J440 Chronic obstructive pulmonary disease with acute lower respiratory infection: Secondary | ICD-10-CM | POA: Diagnosis not present

## 2022-06-01 DIAGNOSIS — I272 Pulmonary hypertension, unspecified: Secondary | ICD-10-CM | POA: Diagnosis not present

## 2022-06-01 DIAGNOSIS — G20A1 Parkinson's disease without dyskinesia, without mention of fluctuations: Secondary | ICD-10-CM | POA: Diagnosis not present

## 2022-06-01 DIAGNOSIS — I340 Nonrheumatic mitral (valve) insufficiency: Secondary | ICD-10-CM | POA: Diagnosis not present

## 2022-06-01 DIAGNOSIS — J189 Pneumonia, unspecified organism: Secondary | ICD-10-CM | POA: Diagnosis not present

## 2022-06-02 DIAGNOSIS — J849 Interstitial pulmonary disease, unspecified: Secondary | ICD-10-CM | POA: Diagnosis not present

## 2022-06-02 DIAGNOSIS — I5020 Unspecified systolic (congestive) heart failure: Secondary | ICD-10-CM | POA: Diagnosis not present

## 2022-06-02 DIAGNOSIS — I340 Nonrheumatic mitral (valve) insufficiency: Secondary | ICD-10-CM | POA: Diagnosis not present

## 2022-06-02 DIAGNOSIS — J189 Pneumonia, unspecified organism: Secondary | ICD-10-CM | POA: Diagnosis not present

## 2022-06-02 DIAGNOSIS — F02A0 Dementia in other diseases classified elsewhere, mild, without behavioral disturbance, psychotic disturbance, mood disturbance, and anxiety: Secondary | ICD-10-CM | POA: Diagnosis not present

## 2022-06-02 DIAGNOSIS — G20A1 Parkinson's disease without dyskinesia, without mention of fluctuations: Secondary | ICD-10-CM | POA: Diagnosis not present

## 2022-06-02 DIAGNOSIS — J440 Chronic obstructive pulmonary disease with acute lower respiratory infection: Secondary | ICD-10-CM | POA: Diagnosis not present

## 2022-06-02 DIAGNOSIS — I272 Pulmonary hypertension, unspecified: Secondary | ICD-10-CM | POA: Diagnosis not present

## 2022-06-02 DIAGNOSIS — J441 Chronic obstructive pulmonary disease with (acute) exacerbation: Secondary | ICD-10-CM | POA: Diagnosis not present

## 2022-06-03 DIAGNOSIS — I5020 Unspecified systolic (congestive) heart failure: Secondary | ICD-10-CM | POA: Diagnosis not present

## 2022-06-03 DIAGNOSIS — J441 Chronic obstructive pulmonary disease with (acute) exacerbation: Secondary | ICD-10-CM | POA: Diagnosis not present

## 2022-06-03 DIAGNOSIS — J440 Chronic obstructive pulmonary disease with acute lower respiratory infection: Secondary | ICD-10-CM | POA: Diagnosis not present

## 2022-06-03 DIAGNOSIS — I340 Nonrheumatic mitral (valve) insufficiency: Secondary | ICD-10-CM | POA: Diagnosis not present

## 2022-06-03 DIAGNOSIS — I272 Pulmonary hypertension, unspecified: Secondary | ICD-10-CM | POA: Diagnosis not present

## 2022-06-03 DIAGNOSIS — G20A1 Parkinson's disease without dyskinesia, without mention of fluctuations: Secondary | ICD-10-CM | POA: Diagnosis not present

## 2022-06-03 DIAGNOSIS — F02A0 Dementia in other diseases classified elsewhere, mild, without behavioral disturbance, psychotic disturbance, mood disturbance, and anxiety: Secondary | ICD-10-CM | POA: Diagnosis not present

## 2022-06-03 DIAGNOSIS — J849 Interstitial pulmonary disease, unspecified: Secondary | ICD-10-CM | POA: Diagnosis not present

## 2022-06-03 DIAGNOSIS — J189 Pneumonia, unspecified organism: Secondary | ICD-10-CM | POA: Diagnosis not present

## 2022-06-04 DIAGNOSIS — I5020 Unspecified systolic (congestive) heart failure: Secondary | ICD-10-CM | POA: Diagnosis not present

## 2022-06-04 DIAGNOSIS — J849 Interstitial pulmonary disease, unspecified: Secondary | ICD-10-CM | POA: Diagnosis not present

## 2022-06-04 DIAGNOSIS — F02A0 Dementia in other diseases classified elsewhere, mild, without behavioral disturbance, psychotic disturbance, mood disturbance, and anxiety: Secondary | ICD-10-CM | POA: Diagnosis not present

## 2022-06-04 DIAGNOSIS — J189 Pneumonia, unspecified organism: Secondary | ICD-10-CM | POA: Diagnosis not present

## 2022-06-04 DIAGNOSIS — J441 Chronic obstructive pulmonary disease with (acute) exacerbation: Secondary | ICD-10-CM | POA: Diagnosis not present

## 2022-06-04 DIAGNOSIS — I509 Heart failure, unspecified: Secondary | ICD-10-CM | POA: Diagnosis not present

## 2022-06-04 DIAGNOSIS — G20A1 Parkinson's disease without dyskinesia, without mention of fluctuations: Secondary | ICD-10-CM | POA: Diagnosis not present

## 2022-06-04 DIAGNOSIS — I272 Pulmonary hypertension, unspecified: Secondary | ICD-10-CM | POA: Diagnosis not present

## 2022-06-04 DIAGNOSIS — I340 Nonrheumatic mitral (valve) insufficiency: Secondary | ICD-10-CM | POA: Diagnosis not present

## 2022-06-04 DIAGNOSIS — J440 Chronic obstructive pulmonary disease with acute lower respiratory infection: Secondary | ICD-10-CM | POA: Diagnosis not present

## 2022-06-07 DIAGNOSIS — F02A0 Dementia in other diseases classified elsewhere, mild, without behavioral disturbance, psychotic disturbance, mood disturbance, and anxiety: Secondary | ICD-10-CM | POA: Diagnosis not present

## 2022-06-07 DIAGNOSIS — I340 Nonrheumatic mitral (valve) insufficiency: Secondary | ICD-10-CM | POA: Diagnosis not present

## 2022-06-07 DIAGNOSIS — J441 Chronic obstructive pulmonary disease with (acute) exacerbation: Secondary | ICD-10-CM | POA: Diagnosis not present

## 2022-06-07 DIAGNOSIS — G20A1 Parkinson's disease without dyskinesia, without mention of fluctuations: Secondary | ICD-10-CM | POA: Diagnosis not present

## 2022-06-07 DIAGNOSIS — I272 Pulmonary hypertension, unspecified: Secondary | ICD-10-CM | POA: Diagnosis not present

## 2022-06-07 DIAGNOSIS — J440 Chronic obstructive pulmonary disease with acute lower respiratory infection: Secondary | ICD-10-CM | POA: Diagnosis not present

## 2022-06-07 DIAGNOSIS — I5020 Unspecified systolic (congestive) heart failure: Secondary | ICD-10-CM | POA: Diagnosis not present

## 2022-06-07 DIAGNOSIS — J189 Pneumonia, unspecified organism: Secondary | ICD-10-CM | POA: Diagnosis not present

## 2022-06-07 DIAGNOSIS — J849 Interstitial pulmonary disease, unspecified: Secondary | ICD-10-CM | POA: Diagnosis not present

## 2022-06-08 ENCOUNTER — Encounter: Payer: Self-pay | Admitting: *Deleted

## 2022-06-09 ENCOUNTER — Encounter: Payer: Self-pay | Admitting: Cardiology

## 2022-06-09 ENCOUNTER — Ambulatory Visit: Payer: Medicare PPO | Attending: Cardiology | Admitting: Cardiology

## 2022-06-09 VITALS — BP 116/70 | HR 64 | Ht 74.0 in | Wt 162.4 lb

## 2022-06-09 DIAGNOSIS — Z136 Encounter for screening for cardiovascular disorders: Secondary | ICD-10-CM

## 2022-06-09 DIAGNOSIS — J189 Pneumonia, unspecified organism: Secondary | ICD-10-CM | POA: Diagnosis not present

## 2022-06-09 DIAGNOSIS — Z79899 Other long term (current) drug therapy: Secondary | ICD-10-CM | POA: Diagnosis not present

## 2022-06-09 DIAGNOSIS — I5020 Unspecified systolic (congestive) heart failure: Secondary | ICD-10-CM | POA: Diagnosis not present

## 2022-06-09 DIAGNOSIS — J440 Chronic obstructive pulmonary disease with acute lower respiratory infection: Secondary | ICD-10-CM | POA: Diagnosis not present

## 2022-06-09 DIAGNOSIS — J441 Chronic obstructive pulmonary disease with (acute) exacerbation: Secondary | ICD-10-CM | POA: Diagnosis not present

## 2022-06-09 MED ORDER — DAPAGLIFLOZIN PROPANEDIOL 10 MG PO TABS: 10.0000 mg | ORAL_TABLET | Freq: Every day | ORAL | 6 refills | Status: AC

## 2022-06-09 MED ORDER — LOSARTAN POTASSIUM 25 MG PO TABS: 12.5000 mg | ORAL_TABLET | Freq: Every day | ORAL | 6 refills | Status: DC

## 2022-06-09 NOTE — Progress Notes (Signed)
Clinical Summary Seth Stout is a 89 y.o.male seen today as a new consult, referred by Dr Woody Seller for the following medical problems.   1.HFrEF - admit 04/2022 [facility] with acute HFrEF - echo LVEF 20-25% - proBNP 5055, CXR with CHF - presentation was also in the setting of pneumonia  - family reports normal echo with Dr Woody Seller about 1 year ago - mild LE edema, has prn O2 at home. Breathing overall improving.  - sedentary lifestyle due to Parkinsons, dementia. Difficult to assess symptoms - compliant with meds  - weight 152-156 lbs and stable.  -   2. Parkinsons disease      3.Dementia - some assistance with ADLs - significant memory issues   4. Afib? Briefly mentioned in medical record without any details givens. Was not discharged on anticoagulation. [facility] showed SR, PACS. Awaiting tracing - at this time I cannot confirm this diagnosis.      6. Palliative care - receiving palliative care at home, not under hospice - he is DNR/DNI  Wife is Seth Stout, followed HF clinic From hospital records DNR/DNI Past Medical History:  Diagnosis Date   Acquired hypothyroidism    Acute HFrEF (heart failure with reduced ejection fraction)    Dementia    Disease of thyroid gland    Hypoxia    Interstitial lung disease    Parkinson's disease    Pleural effusion, right    Pneumonia      No Known Allergies   Current Outpatient Medications  Medication Sig Dispense Refill   acetaminophen (TYLENOL) 650 MG CR tablet Take 650 mg by mouth as needed for pain.     albuterol (VENTOLIN HFA) 108 (90 Base) MCG/ACT inhaler Inhale 2 puffs into the lungs every 6 (six) hours as needed for wheezing or shortness of breath.     aspirin 81 MG EC tablet Take 81 mg by mouth daily.     carbidopa-levodopa (SINEMET IR) 25-100 MG tablet Take 1 tablet by mouth 3 (three) times daily.     docusate sodium (COLACE) 100 MG capsule Take 100 mg by mouth daily.     donepezil  (ARICEPT) 5 MG tablet Take 5 mg by mouth at bedtime.     finasteride (PROSCAR) 5 MG tablet Take 5 mg by mouth daily.     furosemide (LASIX) 20 MG tablet Take 20 mg by mouth daily.     Levothyroxine Sodium 50 MCG CAPS Take 50 mcg by mouth daily.     memantine (NAMENDA) 10 MG tablet Take 10 mg by mouth 2 (two) times daily.     potassium chloride (KLOR-CON) 10 MEQ tablet Take 10 mEq by mouth daily.     No current facility-administered medications for this visit.        No Known Allergies    No family history on file.   Social History Seth Stout reports that he has quit smoking. His smoking use included cigarettes. He does not have any smokeless tobacco history on file. Seth Stout reports no history of alcohol use.   Review of Systems CONSTITUTIONAL: No weight loss, fever, chills, weakness or fatigue.  HEENT: Eyes: No visual loss, blurred vision, double vision or yellow sclerae.No hearing loss, sneezing, congestion, runny nose or sore throat.  SKIN: No rash or itching.  CARDIOVASCULAR: per hpi RESPIRATORY: No shortness of breath, cough or sputum.  GASTROINTESTINAL: No anorexia, nausea, vomiting or diarrhea. No abdominal pain or blood.  GENITOURINARY: No burning  on urination, no polyuria NEUROLOGICAL: No headache, dizziness, syncope, paralysis, ataxia, numbness or tingling in the extremities. No change in bowel or bladder control.  MUSCULOSKELETAL: No muscle, back pain, joint pain or stiffness.  LYMPHATICS: No enlarged nodes. No history of splenectomy.  PSYCHIATRIC: No history of depression or anxiety.  ENDOCRINOLOGIC: No reports of sweating, cold or heat intolerance. No polyuria or polydipsia.  Marland Kitchen   Physical Examination Today's Vitals   06/09/22 1456  BP: 116/70  Pulse: 64  SpO2: 94%  Weight: 162 lb 6.4 oz (73.7 kg)  Height: 6\' 2"  (1.88 m)   Body mass index is 20.85 kg/m.  Gen: resting comfortably, no acute distress HEENT: no scleral icterus, pupils equal round and  reactive, no palptable cervical adenopathy,  CV: RRR, no m/rg, no jvd Resp: Clear to auscultation bilaterally GI: abdomen is soft, non-tender, non-distended, normal bowel sounds, no hepatosplenomegaly MSK: extremities are warm, no edema.  Skin: warm, no rash Neuro:  no focal deficits Psych: appropriate affect   Diagnostic Studies 04/2022 echo 1. The left ventricle is mildly dilated in size with mildly increased wall  thickness.   2. The left ventricular systolic function is normal, LVEF is visually  estimated at 20-25%.    3. There is grade I diastolic dysfunction (impaired relaxation).    4. There is mild mitral valve regurgitation.    5. The left atrium is mildly dilated in size.    6. The right ventricle is normal in size, with normal systolic function.    7. There is mild pulmonary hypertension.    8. IVC size and inspiratory change suggest mildly elevated right atrial  pressure. (5-10 mmHg).       Assessment and Plan   1.HFrEF - euvolemic today - will add losartan 12.5mg  daily, farxiga 10mg  daily. If tolerates may transition to entresto at f/u, MRA in the near future - not a cath candidate given dementia, poor functional capacity -check bmet 1 week      Arnoldo Lenis, M.D.

## 2022-06-09 NOTE — Patient Instructions (Signed)
Medication Instructions:  Begin Losartan 12.5mg  daily Begin Farxiga 10mg  daily Continue all other medications.     Labwork: BMET - order given today  Please do in 1 week  Office will contact with results via phone, letter or mychart.     Testing/Procedures: none  Follow-Up: 3 weeks - Finis Bud, NP   Any Other Special Instructions Will Be Listed Below (If Applicable).   If you need a refill on your cardiac medications before your next appointment, please call your pharmacy.

## 2022-06-10 ENCOUNTER — Encounter: Payer: Self-pay | Admitting: Cardiology

## 2022-06-11 DIAGNOSIS — J441 Chronic obstructive pulmonary disease with (acute) exacerbation: Secondary | ICD-10-CM | POA: Diagnosis not present

## 2022-06-11 DIAGNOSIS — J849 Interstitial pulmonary disease, unspecified: Secondary | ICD-10-CM | POA: Diagnosis not present

## 2022-06-11 DIAGNOSIS — J440 Chronic obstructive pulmonary disease with acute lower respiratory infection: Secondary | ICD-10-CM | POA: Diagnosis not present

## 2022-06-11 DIAGNOSIS — I272 Pulmonary hypertension, unspecified: Secondary | ICD-10-CM | POA: Diagnosis not present

## 2022-06-11 DIAGNOSIS — I340 Nonrheumatic mitral (valve) insufficiency: Secondary | ICD-10-CM | POA: Diagnosis not present

## 2022-06-11 DIAGNOSIS — G20A1 Parkinson's disease without dyskinesia, without mention of fluctuations: Secondary | ICD-10-CM | POA: Diagnosis not present

## 2022-06-11 DIAGNOSIS — I5020 Unspecified systolic (congestive) heart failure: Secondary | ICD-10-CM | POA: Diagnosis not present

## 2022-06-11 DIAGNOSIS — J189 Pneumonia, unspecified organism: Secondary | ICD-10-CM | POA: Diagnosis not present

## 2022-06-11 DIAGNOSIS — F02A0 Dementia in other diseases classified elsewhere, mild, without behavioral disturbance, psychotic disturbance, mood disturbance, and anxiety: Secondary | ICD-10-CM | POA: Diagnosis not present

## 2022-06-12 ENCOUNTER — Encounter: Payer: Self-pay | Admitting: *Deleted

## 2022-06-12 ENCOUNTER — Encounter: Payer: Self-pay | Admitting: Cardiology

## 2022-06-18 DIAGNOSIS — R0602 Shortness of breath: Secondary | ICD-10-CM | POA: Diagnosis not present

## 2022-06-18 DIAGNOSIS — I5020 Unspecified systolic (congestive) heart failure: Secondary | ICD-10-CM | POA: Diagnosis not present

## 2022-06-18 DIAGNOSIS — Z79899 Other long term (current) drug therapy: Secondary | ICD-10-CM | POA: Diagnosis not present

## 2022-06-20 DIAGNOSIS — J441 Chronic obstructive pulmonary disease with (acute) exacerbation: Secondary | ICD-10-CM | POA: Diagnosis not present

## 2022-06-20 DIAGNOSIS — F02A0 Dementia in other diseases classified elsewhere, mild, without behavioral disturbance, psychotic disturbance, mood disturbance, and anxiety: Secondary | ICD-10-CM | POA: Diagnosis not present

## 2022-06-20 DIAGNOSIS — I5020 Unspecified systolic (congestive) heart failure: Secondary | ICD-10-CM | POA: Diagnosis not present

## 2022-06-20 DIAGNOSIS — I340 Nonrheumatic mitral (valve) insufficiency: Secondary | ICD-10-CM | POA: Diagnosis not present

## 2022-06-20 DIAGNOSIS — I272 Pulmonary hypertension, unspecified: Secondary | ICD-10-CM | POA: Diagnosis not present

## 2022-06-20 DIAGNOSIS — J189 Pneumonia, unspecified organism: Secondary | ICD-10-CM | POA: Diagnosis not present

## 2022-06-20 DIAGNOSIS — J440 Chronic obstructive pulmonary disease with acute lower respiratory infection: Secondary | ICD-10-CM | POA: Diagnosis not present

## 2022-06-20 DIAGNOSIS — G20A1 Parkinson's disease without dyskinesia, without mention of fluctuations: Secondary | ICD-10-CM | POA: Diagnosis not present

## 2022-06-20 DIAGNOSIS — J849 Interstitial pulmonary disease, unspecified: Secondary | ICD-10-CM | POA: Diagnosis not present

## 2022-06-23 DIAGNOSIS — J441 Chronic obstructive pulmonary disease with (acute) exacerbation: Secondary | ICD-10-CM | POA: Diagnosis not present

## 2022-06-25 DIAGNOSIS — R310 Gross hematuria: Secondary | ICD-10-CM | POA: Diagnosis not present

## 2022-06-25 DIAGNOSIS — N401 Enlarged prostate with lower urinary tract symptoms: Secondary | ICD-10-CM | POA: Diagnosis not present

## 2022-06-25 DIAGNOSIS — Z125 Encounter for screening for malignant neoplasm of prostate: Secondary | ICD-10-CM | POA: Diagnosis not present

## 2022-07-01 DIAGNOSIS — I509 Heart failure, unspecified: Secondary | ICD-10-CM | POA: Diagnosis not present

## 2022-07-01 DIAGNOSIS — Z515 Encounter for palliative care: Secondary | ICD-10-CM | POA: Diagnosis not present

## 2022-07-01 DIAGNOSIS — R609 Edema, unspecified: Secondary | ICD-10-CM | POA: Diagnosis not present

## 2022-07-01 DIAGNOSIS — R0600 Dyspnea, unspecified: Secondary | ICD-10-CM | POA: Diagnosis not present

## 2022-07-03 ENCOUNTER — Telehealth: Payer: Self-pay | Admitting: Cardiology

## 2022-07-03 ENCOUNTER — Other Ambulatory Visit: Payer: Self-pay | Admitting: Nurse Practitioner

## 2022-07-03 ENCOUNTER — Ambulatory Visit: Payer: Medicare PPO | Attending: Nurse Practitioner | Admitting: Nurse Practitioner

## 2022-07-03 ENCOUNTER — Encounter: Payer: Self-pay | Admitting: Nurse Practitioner

## 2022-07-03 VITALS — BP 104/68 | HR 63 | Ht 74.0 in | Wt 159.4 lb

## 2022-07-03 DIAGNOSIS — I959 Hypotension, unspecified: Secondary | ICD-10-CM | POA: Diagnosis not present

## 2022-07-03 DIAGNOSIS — I5020 Unspecified systolic (congestive) heart failure: Secondary | ICD-10-CM | POA: Diagnosis not present

## 2022-07-03 MED ORDER — MIDODRINE HCL 2.5 MG PO TABS: 2.5000 mg | ORAL_TABLET | Freq: Two times a day (BID) | ORAL | 1 refills | Status: DC | PRN

## 2022-07-03 MED ORDER — METOPROLOL SUCCINATE ER 25 MG PO TB24: 12.5000 mg | ORAL_TABLET | Freq: Every day | ORAL | 1 refills | Status: DC

## 2022-07-03 MED ORDER — FUROSEMIDE 20 MG PO TABS: 10.0000 mg | ORAL_TABLET | ORAL | 1 refills | Status: DC

## 2022-07-03 NOTE — Telephone Encounter (Signed)
*  STAT* If patient is at the pharmacy, call can be transferred to refill team.   1. Which medications need to be refilled? (please list name of each medication and dose if known) furosemide (LASIX) 20 MG tablet   metoprolol succinate (TOPROL-XL) 25 MG 24 hr tablet   2. Which pharmacy/location (including street and city if local pharmacy) is medication to be sent to?  [id number] - [place], [place] - [address] AT [address] & [address]    3. Do they need a 30 day or 90 day supply? 90

## 2022-07-03 NOTE — Progress Notes (Unsigned)
Office Visit    Patient Name: Seth Stout Date of Encounter: 07/03/2022  PCP:  Ignatius Specking, MD   [facility]  Cardiologist:  Dina Rich, MD *** Advanced Practice Provider:  No care team member to display Electrophysiologist:  None  {Press F2 to show EP APP, CHF, sleep or structural heart MD               :[id number]}  { Click here to update then REFRESH NOTE - MD (PCP) or APP (Team Member)  Change PCP Type for MD, Specialty for APP is either Cardiology or Clinical Cardiac Electrophysiology  :[id number]}  Chief Complaint    Seth Stout is a 89 y.o. male with a hx of HFrEF, PD, dementia, hypothyroidism, and ILD, who presents today for 3 week follow-up.   Past Medical History    Past Medical History:  Diagnosis Date   Acquired hypothyroidism    Acute HFrEF (heart failure with reduced ejection fraction) (HCC)    Dementia (HCC)    Disease of thyroid gland    Hypoxia    Interstitial lung disease (HCC)    Parkinson's disease    Pleural effusion, right    Pneumonia    *** The histories are not reviewed yet. Please review them in the "History" navigator section and refresh this SmartLink.  Allergies  No Known Allergies  History of Present Illness    Seth Stout is a 89 y.o. male with a PMH as mentioned above.   Admitted to [facility] on February 2024 with acute HFrEF.  Echocardiogram revealed EF 20 to 25%.  proBNP 5,055.  Chest x-ray showed CHF presentation also in setting of pneumonia.  Previous echocardiogram with PCP normal 1 year ago.  Last seen by Dr. Dina Rich on June 09, 2022.  Was overall doing well at that office visit.  Losartan was added to medication regimen as well as [place].  Stated if tolerating well may transition to Entresto at follow-up with MRA in near future.  Was deemed not to be a cardiac cath candidate due to dementia, poor functional capacity.  Today he presents for 3 to 4-week follow-up.  He  states  EKGs/Labs/Other Studies Reviewed:   The following studies were reviewed today: ***  EKG:  EKG is *** ordered today.  The ekg ordered today demonstrates ***  Recent Labs: No results found for requested labs within last 365 days.  Recent Lipid Panel No results found for: "CHOL", "TRIG", "HDL", "CHOLHDL", "VLDL", "LDLCALC", "LDLDIRECT"  Risk Assessment/Calculations:  {Does this patient have ATRIAL FIBRILLATION?:[phone]}  Home Medications   No outpatient medications have been marked as taking for the 07/03/22 encounter (Appointment) with Sharlene Dory, NP.     Review of Systems   ***   All other systems reviewed and are otherwise negative except as noted above.  Physical Exam    VS:  There were no vitals taken for this visit. , BMI There is no height or weight on file to calculate BMI.  Wt Readings from Last 3 Encounters:  06/09/22 162 lb 6.4 oz (73.7 kg)     GEN: Well nourished, well developed, in no acute distress. HEENT: normal. Neck: Supple, no JVD, carotid bruits, or masses. Cardiac: ***RRR, no murmurs, rubs, or gallops. No clubbing, cyanosis, edema.  ***Radials/PT 2+ and equal bilaterally.  Respiratory:  ***Respirations regular and unlabored, clear to auscultation bilaterally. GI: Soft, nontender, nondistended. MS: No deformity or atrophy. Skin: Warm and dry, no rash. Neuro:  Strength  and sensation are intact. Psych: Normal affect.  Assessment & Plan    ***  {Are you ordering a CV Procedure (e.g. stress test, cath, DCCV, TEE, etc)?   Press F2        :[id number]}  Duo neb treatments. Pulse.     Disposition: Follow up {follow up:15908} with Dina Rich, MD or APP.  Signed, Sharlene Dory, NP 07/03/2022, 8:19 AM [facility]

## 2022-07-03 NOTE — Telephone Encounter (Signed)
Niece made aware, verbalized understanding. Prescription sent to [facility], [place] as requested.

## 2022-07-03 NOTE — Telephone Encounter (Signed)
Niece was calling in to give patient bp 80/60. Please advise

## 2022-07-03 NOTE — Patient Instructions (Signed)
Medication Instructions:  Your physician has recommended you make the following change in your medication:  Take lasix 10 mg every other day, alternating with 20 mg. Continue all other medications as directed.  Labwork: none  Testing/Procedures: none  Follow-Up:  Your physician recommends that you schedule a follow-up appointment in: 6-8 weeks  Any Other Special Instructions Will Be Listed Below (If Applicable).  If you need a refill on your cardiac medications before your next appointment, please call your pharmacy.

## 2022-07-03 NOTE — Telephone Encounter (Signed)
Furosemide filled at [facility] today.  Toprol refill as requested.

## 2022-07-05 DIAGNOSIS — J441 Chronic obstructive pulmonary disease with (acute) exacerbation: Secondary | ICD-10-CM | POA: Diagnosis not present

## 2022-07-05 DIAGNOSIS — I509 Heart failure, unspecified: Secondary | ICD-10-CM | POA: Diagnosis not present

## 2022-07-05 DIAGNOSIS — J189 Pneumonia, unspecified organism: Secondary | ICD-10-CM | POA: Diagnosis not present

## 2022-07-06 ENCOUNTER — Telehealth: Payer: Self-pay | Admitting: Nurse Practitioner

## 2022-07-06 MED ORDER — FUROSEMIDE 20 MG PO TABS: 20.0000 mg | ORAL_TABLET | Freq: Every day | ORAL | 1 refills | Status: AC

## 2022-07-06 NOTE — Telephone Encounter (Signed)
Patient's niece, Elsworth Soho, is returning call.

## 2022-07-06 NOTE — Telephone Encounter (Signed)
Reports 3 lbs weight gain in 1 day. Weight 07/03/2022 (dry weight) was 151 lbs and today 154 lbs. Reports SOB after walking. Reports swelling in legs is 1+ per niece. Reports being able to get shoes on this morning. Denies dizziness or chest pain. Medications reviewed. Reports taking lasix 20 mg daily alternating with 20 mg. Reports taking 10 mg on 07/04/2022, 20 mg on 07/05/2022 and 10 mg today. Reports using midodrine on Saturday and Sunday. Sunday-BP 112/70 after midodrine. O2 Sat today was 88% and was given prn O2. Reports increasing fluid daily. Advised that message would be sent to provider. Verbalized understanding.

## 2022-07-06 NOTE — Telephone Encounter (Signed)
Twice medadrine sat and sun  . Up 3 lbs from yeste 151 154    Pt c/o swelling: STAT is pt has developed SOB within 24 hours  If swelling, where is the swelling located? "Feet were a little swollen"   How much weight have you gained and in what time span? 3 lbs in a day   Have you gained 3 pounds in a day or 5 pounds in a week? 3 lbs in a day   Do you have a log of your daily weights (if so, list)?  Wynelle Link 4/28- 151 Today 4/29- 154  Are you currently taking a fluid pill? Yes   Are you currently SOB? No but she states it happens occasionally   Have you traveled recently? No    She states they gave the pt midodrine twice (once on sat and sun).

## 2022-07-06 NOTE — Telephone Encounter (Signed)
Patient's niece Elsworth Soho informed and verbalized understanding of plan. Reports BP is now 80/56 with midodrine. Says she will recheck BP once she arrives to patient's home and if still low, will take patient to ED as previously instructed.

## 2022-07-08 ENCOUNTER — Other Ambulatory Visit: Payer: Self-pay

## 2022-07-08 ENCOUNTER — Emergency Department (HOSPITAL_COMMUNITY): Payer: Medicare PPO

## 2022-07-08 ENCOUNTER — Encounter (HOSPITAL_COMMUNITY): Payer: Self-pay

## 2022-07-08 ENCOUNTER — Emergency Department (HOSPITAL_COMMUNITY)
Admission: EM | Admit: 2022-07-08 | Discharge: 2022-07-08 | Disposition: A | Discharge status: Home / Self Care | Payer: Medicare PPO | Attending: Emergency Medicine | Admitting: Emergency Medicine

## 2022-07-08 DIAGNOSIS — R0602 Shortness of breath: Secondary | ICD-10-CM | POA: Diagnosis not present

## 2022-07-08 DIAGNOSIS — Z7982 Long term (current) use of aspirin: Secondary | ICD-10-CM | POA: Insufficient documentation

## 2022-07-08 DIAGNOSIS — F0280 Dementia in other diseases classified elsewhere without behavioral disturbance: Secondary | ICD-10-CM | POA: Diagnosis not present

## 2022-07-08 DIAGNOSIS — I959 Hypotension, unspecified: Secondary | ICD-10-CM | POA: Diagnosis not present

## 2022-07-08 DIAGNOSIS — E039 Hypothyroidism, unspecified: Secondary | ICD-10-CM | POA: Diagnosis not present

## 2022-07-08 DIAGNOSIS — I509 Heart failure, unspecified: Secondary | ICD-10-CM | POA: Insufficient documentation

## 2022-07-08 DIAGNOSIS — G20C Parkinsonism, unspecified: Secondary | ICD-10-CM | POA: Insufficient documentation

## 2022-07-08 DIAGNOSIS — I700 Atherosclerosis of aorta: Secondary | ICD-10-CM | POA: Diagnosis not present

## 2022-07-08 DIAGNOSIS — J811 Chronic pulmonary edema: Secondary | ICD-10-CM | POA: Diagnosis not present

## 2022-07-08 LAB — CBC
HCT: 42.8 % (ref 39.0–52.0)
Hemoglobin: 13.9 g/dL (ref 13.0–17.0)
MCH: 31.2 pg (ref 26.0–34.0)
MCHC: 32.5 g/dL (ref 30.0–36.0)
MCV: 96.2 fL (ref 80.0–100.0)
Platelets: 199 10*3/uL (ref 150–400)
RBC: 4.45 MIL/uL (ref 4.22–5.81)
RDW: 15.4 % (ref 11.5–15.5)
WBC: 5.6 10*3/uL (ref 4.0–10.5)
nRBC: 0 % (ref 0.0–0.2)

## 2022-07-08 LAB — TROPONIN I (HIGH SENSITIVITY): Troponin I (High Sensitivity): 17 ng/L (ref <18)

## 2022-07-08 LAB — BASIC METABOLIC PANEL
Anion gap: 8 (ref 5–15)
BUN: 22 mg/dL (ref 8–23)
CO2: 28 mmol/L (ref 22–32)
Calcium: 8.5 mg/dL — ABNORMAL LOW (ref 8.9–10.3)
Chloride: 99 mmol/L (ref 98–111)
Creatinine, Ser: 0.88 mg/dL (ref 0.61–1.24)
GFR, Estimated: >60 mL/min (ref >60)
Glucose, Bld: 103 mg/dL — ABNORMAL HIGH (ref 70–99)
Potassium: 3.4 mmol/L — ABNORMAL LOW (ref 3.5–5.1)
Sodium: 135 mmol/L (ref 135–145)

## 2022-07-08 LAB — MAGNESIUM: Magnesium: 2.3 mg/dL (ref 1.7–2.4)

## 2022-07-08 NOTE — ED Notes (Signed)
Pts wallet given to niece and POA, Nedra Hai, as pt was undressed to use urinal

## 2022-07-08 NOTE — ED Triage Notes (Signed)
Pt from home with niece and brother, (POA) pt is pt of Dr Wyline Mood, cardiologist, family called office after giving 2 doses of midodrine as instructed by Dr Wyline Mood, then brought pt here for evaluation as instructed. Pt says he feels fine. BP in triage is 112/78.

## 2022-07-08 NOTE — ED Provider Notes (Signed)
[name] EMERGENCY DEPARTMENT AT [facility] Provider Note   CSN: [id number] Arrival date & time: 07/08/22  1629     History  Chief Complaint  Patient presents with   Hypotension    Seth Stout is a 89 y.o. male.  Pt is a 89 yo with pmhx significant for CHF (EF 20-25%), Parkinson's disease, dementia, hypothyroidism, and interstitial lung disease.  Pt has been taking lasix qod, farxiga, losartan, and Toprol XL.  Pt's BPs have been running low, so midodrine 2.5 mg BID was started on 4/26 if BP <90.  Pt's niece is pt's main care giver.  She said his bp was low (80s) today.  She gave him midodrine today.  Initially, bps were low, so she brought him here.  BP is ok now.  Pt feels well.          Home Medications Prior to Admission medications   Medication Sig Start Date End Date Taking? Authorizing Provider  albuterol (VENTOLIN HFA) 108 (90 Base) MCG/ACT inhaler Inhale 2 puffs into the lungs every 6 (six) hours as needed for wheezing or shortness of breath.    [provider]  aspirin 81 MG EC tablet Take 81 mg by mouth daily.    [provider]  carbidopa-levodopa (SINEMET IR) 25-100 MG tablet Take 1 tablet by mouth 3 (three) times daily. 04/07/19   [provider]  dapagliflozin propanediol (FARXIGA) 10 MG TABS tablet Take 1 tablet (10 mg total) by mouth daily. 06/09/22   Antoine Poche, MD  docusate sodium (COLACE) 100 MG capsule Take 100 mg by mouth daily as needed.    [provider]  donepezil (ARICEPT) 5 MG tablet Take 5 mg by mouth at bedtime. 03/31/19   [provider]  finasteride (PROSCAR) 5 MG tablet Take 5 mg by mouth daily.    [provider]  furosemide (LASIX) 20 MG tablet Take 1 tablet (20 mg total) by mouth daily. Alternating with 20 mg 07/06/22   Sharlene Dory, NP  Levothyroxine Sodium 50 MCG CAPS Take 50 mcg by mouth daily. 03/13/19   [provider]  losartan (COZAAR) 25 MG tablet Take 0.5  tablets (12.5 mg total) by mouth daily. 06/09/22   Antoine Poche, MD  memantine (NAMENDA) 10 MG tablet Take 10 mg by mouth 2 (two) times daily. 03/31/19   [provider]  metoprolol succinate (TOPROL-XL) 25 MG 24 hr tablet Take 0.5 tablets (12.5 mg total) by mouth daily. 07/03/22   Sharlene Dory, NP  midodrine (PROAMATINE) 2.5 MG tablet TAKE 1 TABLET(2.5 MG) BY MOUTH TWICE DAILY AS NEEDED FOR BLOOD PRESSURE. LESS THAN 90/60. IF NO IMPROVEMENT AFTER TAKING 1 TABLET, GOTO THE ER 07/03/22   Sharlene Dory, NP  Multiple Vitamins-Minerals (PRESERVISION AREDS) TABS Take 1 tablet by mouth 2 (two) times daily.    [provider]  OXYGEN Inhale 2 L into the lungs as needed (sob).    [provider]  potassium chloride (KLOR-CON) 10 MEQ tablet Take 10 mEq by mouth daily.    [provider]      Allergies    Patient has no known allergies.    Review of Systems   Review of Systems  All other systems reviewed and are negative.   Physical Exam Updated Vital Signs BP (!) 102/57   Pulse (!) 56   Temp 98 F (36.7 C) (Oral)   Resp 18   Ht 6\' 2"  (1.88 m)   Wt 72.3 kg  SpO2 98%   BMI 20.46 kg/m  Physical Exam Vitals and nursing note reviewed.  Constitutional:      Appearance: Normal appearance.  HENT:     Head: Normocephalic and atraumatic.     Right Ear: External ear normal.     Left Ear: External ear normal.     Nose: Nose normal.     Mouth/Throat:     Mouth: Mucous membranes are moist.     Pharynx: Oropharynx is clear.  Eyes:     Extraocular Movements: Extraocular movements intact.     Conjunctiva/sclera: Conjunctivae normal.     Pupils: Pupils are equal, round, and reactive to light.  Cardiovascular:     Rate and Rhythm: Regular rhythm. Bradycardia present.     Pulses: Normal pulses.     Heart sounds: Normal heart sounds.  Pulmonary:     Effort: Pulmonary effort is normal.     Breath sounds: Normal breath sounds.  Abdominal:     General:  Abdomen is flat. Bowel sounds are normal.     Palpations: Abdomen is soft.  Musculoskeletal:        General: Normal range of motion.     Cervical back: Normal range of motion and neck supple.  Skin:    General: Skin is warm.     Capillary Refill: Capillary refill takes less than 2 seconds.  Neurological:     General: No focal deficit present.     Mental Status: He is alert and oriented to person, place, and time.  Psychiatric:        Mood and Affect: Mood normal.        Behavior: Behavior normal.     ED Results / Procedures / Treatments   Labs (all labs ordered are listed, but only abnormal results are displayed) Labs Reviewed  BASIC METABOLIC PANEL - Abnormal; Notable for the following components:      Result Value   Potassium 3.4 (*)    Glucose, Bld 103 (*)    Calcium 8.5 (*)    All other components within normal limits  CBC  MAGNESIUM  TROPONIN I (HIGH SENSITIVITY)    EKG EKG Interpretation  Date/Time:  Wednesday Jul 08 2022 16:57:46 EDT Ventricular Rate:  64 PR Interval:  209 QRS Duration: 175 QT Interval:  479 QTC Calculation: 495 R Axis:   -37 Text Interpretation: Sinus rhythm LAE, consider biatrial enlargement Left bundle branch block Baseline wander in lead(s) I II aVR No old tracing to compare Confirmed by Jacalyn Lefevre [phone]) on 07/08/2022 5:03:24 PM  No old EKG in [place], but report from [facility] on 2/25 shows and EKG with LBBB. Radiology DG Chest Port 1 View  Result Date: 07/08/2022 CLINICAL DATA:  Shortness of breath EXAM: PORTABLE CHEST 1 VIEW COMPARISON:  05/03/2022 FINDINGS: Moderate enlargement of the cardiopericardial silhouette. Atherosclerotic calcification of the aortic arch. Substantially improved aeration at the right lung base and reduced findings of acute pulmonary edema compared to the prior exam. Currently no significant blunting of the costophrenic angles or overt edema noted. Thoracic spondylosis. IMPRESSION: 1. Moderate  enlargement of the cardiopericardial silhouette, without edema. 2. Resolution of prior right basilar airspace opacity and of the previous appearance of interstitial edema. Electronically Signed   By: Gaylyn Rong M.D.   On: 07/08/2022 17:23    Procedures Procedures    Medications Ordered in ED Medications - No data to display  ED Course/ Medical Decision Making/ A&P  Medical Decision Making Amount and/or Complexity of Data Reviewed Labs: ordered. Radiology: ordered.   This patient presents to the ED for concern of hypotension, this involves an extensive number of treatment options, and is a complaint that carries with it a high risk of complications and morbidity.  The differential diagnosis includes medications, infection   Co morbidities that complicate the patient evaluation  CHF (EF 20-25%), Parkinson's disease, dementia, hypothyroidism, and interstitial lung disease   Additional history obtained:  Additional history obtained from epic chart review External records from outside source obtained and reviewed including family   Lab Tests:  I Ordered, and personally interpreted labs.  The pertinent results include:  cbc nl, bmp nl, mg nl, trop nl   Imaging Studies ordered:  I ordered imaging studies including cxr  I independently visualized and interpreted imaging which showed   Moderate enlargement of the cardiopericardial silhouette, without  edema.  2. Resolution of prior right basilar airspace opacity and of the  previous appearance of interstitial edema.   I agree with the radiologist interpretation   Cardiac Monitoring:  The patient was maintained on a cardiac monitor.  I personally viewed and interpreted the cardiac monitored which showed an underlying rhythm of: nsr   Medicines ordered and prescription drug management:   I have reviewed the patients home medicines and have made adjustments as needed   Problem List /  ED Course:  Hypotension:  resolved with midodrine.  Pt looks good and feels well.  HR has been dropping to the 40s per family and with bp in the 80s at home, I am going to hold his Toprol until he sees Dr. Wyline Mood again.   Reevaluation:  After the interventions noted above, I reevaluated the patient and found that they have :improved   Social Determinants of Health:  Lives at home   Dispostion:  After consideration of the diagnostic results and the patients response to treatment, I feel that the patent would benefit from discharge with outpatient f/u.          Final Clinical Impression(s) / ED Diagnoses Final diagnoses:  Hypotension, unspecified hypotension type    Rx / DC Orders ED Discharge Orders     None         Jacalyn Lefevre, MD 07/08/22 1825

## 2022-07-08 NOTE — Discharge Instructions (Addendum)
Hold the Toprol XL until you see Dr. Wyline Mood.

## 2022-07-20 ENCOUNTER — Encounter: Payer: Self-pay | Admitting: Nurse Practitioner

## 2022-07-20 ENCOUNTER — Ambulatory Visit: Payer: Medicare PPO | Attending: Nurse Practitioner | Admitting: Nurse Practitioner

## 2022-07-20 VITALS — BP 110/60 | HR 76 | Ht 74.0 in | Wt 158.8 lb

## 2022-07-20 DIAGNOSIS — I5020 Unspecified systolic (congestive) heart failure: Secondary | ICD-10-CM | POA: Diagnosis not present

## 2022-07-20 DIAGNOSIS — I959 Hypotension, unspecified: Secondary | ICD-10-CM | POA: Diagnosis not present

## 2022-07-20 DIAGNOSIS — R001 Bradycardia, unspecified: Secondary | ICD-10-CM

## 2022-07-20 DIAGNOSIS — R1310 Dysphagia, unspecified: Secondary | ICD-10-CM | POA: Diagnosis not present

## 2022-07-20 NOTE — Patient Instructions (Addendum)
Medication Instructions:  Your physician recommends that you continue on your current medications as directed. Please refer to the Current Medication list given to you today.   Labwork: none  Testing/Procedures: none  Follow-Up: Your physician recommends that you schedule a follow-up appointment in: 6 weeks (phone visit)  Any Other Special Instructions Will Be Listed Below (If Applicable).  If you need a refill on your cardiac medications before your next appointment, please call your pharmacy.

## 2022-07-20 NOTE — Progress Notes (Unsigned)
Office Visit    Patient Name: Seth Stout Date of Encounter: 07/20/2022  PCP:  Ignatius Specking, MD   [facility]  Cardiologist:  Dina Rich, MD  Advanced Practice Provider:  No care team member to display Electrophysiologist:  None   Chief Complaint    Seth Stout is a 89 y.o. male with a hx of HFrEF, PD, dementia, hypothyroidism, and ILD, who presents today for scheduled follow-up.   Past Medical History    Past Medical History:  Diagnosis Date   Acquired hypothyroidism    Acute HFrEF (heart failure with reduced ejection fraction) (HCC)    Dementia (HCC)    Dementia (HCC)    Disease of thyroid gland    Hypoxia    Interstitial lung disease (HCC)    Parkinson's disease    Pleural effusion, right    Pneumonia     Allergies  No Known Allergies  History of Present Illness    Seth Stout is a 89 y.o. male with a PMH as mentioned above.   Admitted to [facility] on February 2024 with acute HFrEF.  Echocardiogram revealed EF 20 to 25%.  proBNP 5,055.  Chest x-ray showed CHF presentation also in setting of pneumonia.  Previous echocardiogram with PCP normal 1 year ago.  Last seen by Dr. Dina Rich on June 09, 2022.  Was overall doing well at that office visit.  Losartan was added to medication regimen as well as [place].  Stated if tolerating well may transition to Entresto at follow-up with [facility] in near future.  Was deemed not to be a cardiac cath candidate due to dementia, poor functional capacity.  I last saw him for 3 to 4-week follow-up with his niece on 07/03/2022. Niece acts as his historian.  He stated he was doing well. Denied any chest pain, shortness of breath, palpitations, syncope, presyncope, dizziness, orthopnea, PND, swelling or significant weight changes, acute bleeding, or claudication. BP's were running soft per niece's report.   Visited ED on 07/08/2022 for hypotension. Was previously started on midodrine for his low  BP's. BP was okay on arrival to ED after taking a midodrine, 102/57. Pt was feeling well. Vital signs were overall stable, HR 56 bpm. EKG revealed SR, 64 bpm, with biatrial enlargement and LBBB. Per family's report, HR dropping in 40's. Toprol XL was held.   Today he presents for evaluation. He says he is feeling well. Niece mentions some coughing/O2 dropping when he eats, says speech therapist is following him. Has only taken midodrine 1 time. Weight is stable. Denies any chest pain, shortness of breath, palpitations, syncope, presyncope, dizziness, orthopnea, PND, swelling or significant weight changes, acute bleeding, or claudication. Niece denies any recent bradycardia.   EKGs/Labs/Other Studies Reviewed:   The following studies were reviewed today:   EKG:  EKG is not ordered today.    Echo 04/2022 [facility]): EF 20-25%   Recent Labs: 07/08/2022: BUN 22; Creatinine, Ser 0.88; Hemoglobin 13.9; Magnesium 2.3; Platelets 199; Potassium 3.4; Sodium 135  Recent Lipid Panel No results found for: "CHOL", "TRIG", "HDL", "CHOLHDL", "VLDL", "LDLCALC", "LDLDIRECT"   Home Medications   Current Meds  Medication Sig   albuterol (VENTOLIN HFA) 108 (90 Base) MCG/ACT inhaler Inhale 2 puffs into the lungs every 6 (six) hours as needed for wheezing or shortness of breath.   aspirin 81 MG EC tablet Take 81 mg by mouth daily.   carbidopa-levodopa (SINEMET IR) 25-100 MG tablet Take 1 tablet by mouth 3 (  three) times daily.   dapagliflozin propanediol (FARXIGA) 10 MG TABS tablet Take 1 tablet (10 mg total) by mouth daily.   docusate sodium (COLACE) 100 MG capsule Take 100 mg by mouth daily as needed.   donepezil (ARICEPT) 5 MG tablet Take 5 mg by mouth at bedtime.   finasteride (PROSCAR) 5 MG tablet Take 5 mg by mouth daily.   furosemide (LASIX) 20 MG tablet Take 1 tablet (20 mg total) by mouth daily. Alternating with 20 mg   Levothyroxine Sodium 50 MCG CAPS Take 50 mcg by mouth daily.   losartan  (COZAAR) 25 MG tablet Take 0.5 tablets (12.5 mg total) by mouth daily.   memantine (NAMENDA) 10 MG tablet Take 10 mg by mouth 2 (two) times daily.   midodrine (PROAMATINE) 2.5 MG tablet TAKE 1 TABLET(2.5 MG) BY MOUTH TWICE DAILY AS NEEDED FOR BLOOD PRESSURE. LESS THAN 90/60. IF NO IMPROVEMENT AFTER TAKING 1 TABLET, GOTO THE ER   Multiple Vitamins-Minerals (PRESERVISION AREDS) TABS Take 1 tablet by mouth 2 (two) times daily.   OXYGEN Inhale 2 L into the lungs as needed (sob).   potassium chloride (KLOR-CON) 10 MEQ tablet Take 10 mEq by mouth daily.    Review of Systems    All other systems reviewed and are otherwise negative except as noted above.  Physical Exam    VS:  BP 110/60   Pulse 76   Ht 6\' 2"  (1.88 m)   Wt 158 lb 12.8 oz (72 kg)   SpO2 94%   BMI 20.39 kg/m  , BMI Body mass index is 20.39 kg/m.  Wt Readings from Last 3 Encounters:  07/20/22 158 lb 12.8 oz (72 kg)  07/08/22 159 lb 6.3 oz (72.3 kg)  07/03/22 159 lb 6.4 oz (72.3 kg)     GEN: Thin, 89 y.o. male, in no acute distress. HEENT: normal. Neck: Supple, no JVD, carotid bruits, or masses. Cardiac: S1/S2, RRR, no murmurs, rubs, or gallops. No clubbing, cyanosis, edema.  Radials/PT 2+ and equal bilaterally.  Respiratory:  Respirations regular and unlabored, clear to auscultation bilaterally. MS: No deformity or atrophy. Skin: Warm and dry, no rash. Neuro:  Strength and sensation are intact. Psych: Normal affect.  Assessment & Plan    HFrEF Echo 04/2022 at [facility] showed EF 20-25%, previous Echo with PCP showed normal EF. Euvolemic and well compensated on exam. GDMT limited at this time, unable to further increase medications. Continue Farxiga and Losartan. Low sodium diet, fluid restriction <2L, and daily weights encouraged. Educated to contact our office for weight gain of 2 lbs overnight or 5 lbs in one week. If unable to further titrate further, consider planning updating Echo within 3-6 months. Consider HF  clinic referral in future.   2. Hypotension  BP soft during OV. Has only taken midodrine 1 time per her report. Care and ED precautions discussed. Discussed to monitor BP at home at least 2 hours after medications and sitting for 5-10 minutes. No other medication changes at this time.   3. Bradycardia Family reported HR in 40's at time. Toprol has been on hold. Today HR is stable. Will continue to monitor and if HR continues to drop, would recommend arranging Zio monitor.   4. Dysphagia? Wondering if pt has some dysphagia with eating meals. Suggested to continue to follow-up with speech therapist. Niece verbalized understanding.    Disposition: Follow up in 6 week(s) with Dina Rich, MD or APP via telephone visit.  Signed, Sharlene Dory, NP 07/21/2022, 4:56  PM [facility]

## 2022-07-21 ENCOUNTER — Encounter (HOSPITAL_COMMUNITY): Payer: Self-pay

## 2022-07-21 ENCOUNTER — Observation Stay (HOSPITAL_COMMUNITY)
Admission: AC | Admit: 2022-07-21 | Discharge: 2022-07-22 | Disposition: A | Discharge status: Home / Self Care | Payer: Medicare PPO | Source: Other Acute Inpatient Hospital | Attending: Pulmonary Disease | Admitting: Pulmonary Disease

## 2022-07-21 DIAGNOSIS — R079 Chest pain, unspecified: Secondary | ICD-10-CM | POA: Diagnosis not present

## 2022-07-21 DIAGNOSIS — G20C Parkinsonism, unspecified: Secondary | ICD-10-CM | POA: Insufficient documentation

## 2022-07-21 DIAGNOSIS — I5021 Acute systolic (congestive) heart failure: Secondary | ICD-10-CM | POA: Diagnosis not present

## 2022-07-21 DIAGNOSIS — I441 Atrioventricular block, second degree: Secondary | ICD-10-CM | POA: Diagnosis not present

## 2022-07-21 DIAGNOSIS — F0390 Unspecified dementia without behavioral disturbance: Secondary | ICD-10-CM | POA: Insufficient documentation

## 2022-07-21 DIAGNOSIS — Z7982 Long term (current) use of aspirin: Secondary | ICD-10-CM | POA: Diagnosis not present

## 2022-07-21 DIAGNOSIS — E039 Hypothyroidism, unspecified: Secondary | ICD-10-CM | POA: Insufficient documentation

## 2022-07-21 DIAGNOSIS — F0280 Dementia in other diseases classified elsewhere without behavioral disturbance: Secondary | ICD-10-CM | POA: Diagnosis not present

## 2022-07-21 DIAGNOSIS — I959 Hypotension, unspecified: Secondary | ICD-10-CM | POA: Diagnosis not present

## 2022-07-21 DIAGNOSIS — Z79890 Hormone replacement therapy (postmenopausal): Secondary | ICD-10-CM | POA: Diagnosis not present

## 2022-07-21 DIAGNOSIS — R001 Bradycardia, unspecified: Principal | ICD-10-CM | POA: Diagnosis present

## 2022-07-21 DIAGNOSIS — R55 Syncope and collapse: Secondary | ICD-10-CM | POA: Diagnosis not present

## 2022-07-21 DIAGNOSIS — Z87891 Personal history of nicotine dependence: Secondary | ICD-10-CM | POA: Insufficient documentation

## 2022-07-21 DIAGNOSIS — Z79899 Other long term (current) drug therapy: Secondary | ICD-10-CM | POA: Insufficient documentation

## 2022-07-21 DIAGNOSIS — J8489 Other specified interstitial pulmonary diseases: Secondary | ICD-10-CM | POA: Diagnosis not present

## 2022-07-21 DIAGNOSIS — E079 Disorder of thyroid, unspecified: Secondary | ICD-10-CM | POA: Diagnosis not present

## 2022-07-21 DIAGNOSIS — G20A1 Parkinson's disease without dyskinesia, without mention of fluctuations: Secondary | ICD-10-CM | POA: Diagnosis not present

## 2022-07-22 ENCOUNTER — Inpatient Hospital Stay (HOSPITAL_BASED_OUTPATIENT_CLINIC_OR_DEPARTMENT_OTHER): Payer: Medicare PPO

## 2022-07-22 DIAGNOSIS — R55 Syncope and collapse: Secondary | ICD-10-CM

## 2022-07-22 DIAGNOSIS — R9431 Abnormal electrocardiogram [ECG] [EKG]: Secondary | ICD-10-CM

## 2022-07-22 DIAGNOSIS — I5022 Chronic systolic (congestive) heart failure: Secondary | ICD-10-CM | POA: Diagnosis not present

## 2022-07-22 DIAGNOSIS — E039 Hypothyroidism, unspecified: Secondary | ICD-10-CM | POA: Diagnosis not present

## 2022-07-22 DIAGNOSIS — Z7982 Long term (current) use of aspirin: Secondary | ICD-10-CM | POA: Diagnosis not present

## 2022-07-22 DIAGNOSIS — F0390 Unspecified dementia without behavioral disturbance: Secondary | ICD-10-CM | POA: Diagnosis not present

## 2022-07-22 DIAGNOSIS — Z87891 Personal history of nicotine dependence: Secondary | ICD-10-CM | POA: Diagnosis not present

## 2022-07-22 DIAGNOSIS — R001 Bradycardia, unspecified: Secondary | ICD-10-CM

## 2022-07-22 DIAGNOSIS — I5021 Acute systolic (congestive) heart failure: Secondary | ICD-10-CM | POA: Diagnosis not present

## 2022-07-22 DIAGNOSIS — Z79899 Other long term (current) drug therapy: Secondary | ICD-10-CM | POA: Diagnosis not present

## 2022-07-22 DIAGNOSIS — G20C Parkinsonism, unspecified: Secondary | ICD-10-CM | POA: Diagnosis not present

## 2022-07-22 LAB — ECHOCARDIOGRAM COMPLETE
AR max vel: 1.47 cm2
AV Area VTI: 1.42 cm2
AV Area mean vel: 1.33 cm2
AV Mean grad: 4 mmHg
AV Peak grad: 7.6 mmHg
Ao pk vel: 1.38 m/s
Calc EF: 33.1 %
Est EF: 30
S' Lateral: 4.6 cm
Single Plane A2C EF: 41 %
Single Plane A4C EF: 31.4 %
Weight: 2451.52 oz

## 2022-07-22 LAB — BASIC METABOLIC PANEL
Anion gap: 10 (ref 5–15)
BUN: 31 mg/dL — ABNORMAL HIGH (ref 8–23)
CO2: 26 mmol/L (ref 22–32)
Calcium: 8.7 mg/dL — ABNORMAL LOW (ref 8.9–10.3)
Chloride: 105 mmol/L (ref 98–111)
Creatinine, Ser: 0.76 mg/dL (ref 0.61–1.24)
GFR, Estimated: >60 mL/min (ref >60)
Glucose, Bld: 116 mg/dL — ABNORMAL HIGH (ref 70–99)
Potassium: 5 mmol/L (ref 3.5–5.1)
Sodium: 141 mmol/L (ref 135–145)

## 2022-07-22 LAB — CBC
HCT: 41 % (ref 39.0–52.0)
Hemoglobin: 13.3 g/dL (ref 13.0–17.0)
MCH: 31.1 pg (ref 26.0–34.0)
MCHC: 32.4 g/dL (ref 30.0–36.0)
MCV: 95.8 fL (ref 80.0–100.0)
Platelets: 233 10*3/uL (ref 150–400)
RBC: 4.28 MIL/uL (ref 4.22–5.81)
RDW: 15.4 % (ref 11.5–15.5)
WBC: 9.4 10*3/uL (ref 4.0–10.5)
nRBC: 0 % (ref 0.0–0.2)

## 2022-07-22 LAB — TSH: TSH: 1.787 u[IU]/mL (ref 0.350–4.500)

## 2022-07-22 LAB — GLUCOSE, CAPILLARY
Glucose-Capillary: 117 mg/dL — ABNORMAL HIGH (ref 70–99)
Glucose-Capillary: 82 mg/dL (ref 70–99)
Glucose-Capillary: 87 mg/dL (ref 70–99)

## 2022-07-22 LAB — HEMOGLOBIN A1C
Hgb A1c MFr Bld: 6.5 % — ABNORMAL HIGH (ref 4.8–5.6)
Mean Plasma Glucose: 139.85 mg/dL

## 2022-07-22 LAB — BRAIN NATRIURETIC PEPTIDE: B Natriuretic Peptide: 428.5 pg/mL — ABNORMAL HIGH (ref 0.0–100.0)

## 2022-07-22 LAB — PHOSPHORUS: Phosphorus: 3.1 mg/dL (ref 2.5–4.6)

## 2022-07-22 LAB — MAGNESIUM: Magnesium: 2.2 mg/dL (ref 1.7–2.4)

## 2022-07-22 LAB — MRSA NEXT GEN BY PCR, NASAL: MRSA by PCR Next Gen: NOT DETECTED

## 2022-07-22 LAB — T4, FREE: Free T4: 1.09 ng/dL (ref 0.61–1.12)

## 2022-07-22 MED ORDER — POLYETHYLENE GLYCOL 3350 17 G PO PACK: 17.0000 g | PACK | Freq: Every day | ORAL | Status: DC | PRN

## 2022-07-22 MED ORDER — DOCUSATE SODIUM 100 MG PO CAPS: 100.0000 mg | ORAL_CAPSULE | Freq: Two times a day (BID) | ORAL | Status: DC | PRN

## 2022-07-22 MED ORDER — CARBIDOPA-LEVODOPA 25-100 MG PO TABS
1.0000 | ORAL_TABLET | Freq: Three times a day (TID) | ORAL | Status: DC
  Administered 2022-07-22 (×2): 1 via ORAL
  Filled 2022-07-22 (×3): qty 1

## 2022-07-22 MED ORDER — INSULIN ASPART 100 UNIT/ML IJ SOLN: 0.0000 [IU] | Freq: Every day | INTRAMUSCULAR | Status: DC

## 2022-07-22 MED ORDER — FINASTERIDE 5 MG PO TABS
5.0000 mg | ORAL_TABLET | Freq: Every day | ORAL | Status: DC
  Administered 2022-07-22: 5 mg via ORAL
  Filled 2022-07-22: qty 1

## 2022-07-22 MED ORDER — HEPARIN SODIUM (PORCINE) 5000 UNIT/ML IJ SOLN
5000.0000 [IU] | Freq: Three times a day (TID) | INTRAMUSCULAR | Status: DC
  Administered 2022-07-22 (×2): 5000 [IU] via SUBCUTANEOUS
  Filled 2022-07-22 (×3): qty 1

## 2022-07-22 MED ORDER — INSULIN ASPART 100 UNIT/ML IJ SOLN: 0.0000 [IU] | Freq: Three times a day (TID) | INTRAMUSCULAR | Status: DC

## 2022-07-22 MED ORDER — LEVOTHYROXINE SODIUM 50 MCG PO TABS
50.0000 ug | ORAL_TABLET | Freq: Every day | ORAL | Status: DC
  Administered 2022-07-22: 50 ug via ORAL
  Filled 2022-07-22 (×2): qty 1

## 2022-07-22 MED ORDER — ALBUTEROL SULFATE (2.5 MG/3ML) 0.083% IN NEBU: 3.0000 mL | INHALATION_SOLUTION | Freq: Four times a day (QID) | RESPIRATORY_TRACT | Status: DC | PRN

## 2022-07-22 MED ORDER — MEMANTINE HCL 10 MG PO TABS
10.0000 mg | ORAL_TABLET | Freq: Two times a day (BID) | ORAL | Status: DC
  Administered 2022-07-22 (×2): 10 mg via ORAL
  Filled 2022-07-22 (×3): qty 1

## 2022-07-22 MED ORDER — MIDODRINE HCL 5 MG PO TABS
2.5000 mg | ORAL_TABLET | Freq: Two times a day (BID) | ORAL | Status: DC
  Administered 2022-07-22 (×2): 2.5 mg via ORAL
  Filled 2022-07-22 (×2): qty 1

## 2022-07-22 MED ORDER — CHLORHEXIDINE GLUCONATE CLOTH 2 % EX PADS
6.0000 | MEDICATED_PAD | Freq: Every day | CUTANEOUS | Status: DC
  Administered 2022-07-22: 6 via TOPICAL

## 2022-07-22 MED ORDER — ORAL CARE MOUTH RINSE: 15.0000 mL | OROMUCOSAL | Status: DC | PRN

## 2022-07-22 MED ORDER — DOPAMINE-DEXTROSE 3.2-5 MG/ML-% IV SOLN
0.0000 ug/kg/min | INTRAVENOUS | Status: DC
  Administered 2022-07-22: 2.225 ug/kg/min via INTRAVENOUS
  Filled 2022-07-22: qty 250

## 2022-07-22 MED ORDER — DONEPEZIL HCL 5 MG PO TABS: 5.0000 mg | ORAL_TABLET | Freq: Every day | ORAL | Status: DC

## 2022-07-22 NOTE — Consult Note (Signed)
Cardiology Consultation   Patient ID: Seth Stout MRN: [id number]; DOB: 11-25-1933  Admit date: 07/21/2022 Date of Consult: 07/22/2022  PCP:  Ignatius Specking, MD   [facility] Providers Cardiologist:  Dina Rich, MD        Patient Profile:   Seth Stout is a 89 y.o. male with a hx of severe dementia, interstitial lung disease, Parkinson's disease, hypotension on midodrine and recently diagnosed HFrEF who is being seen 07/22/2022 for the evaluation of bradycardia at the request of Dr. Denese Killings.  History of Present Illness:   Seth Stout is a 89 year old male with past medical history of severe dementia, interstitial lung disease, Parkinson's disease, hypotension on midodrine and recently diagnosed HFrEF.  Previous echocardiogram obtained a year ago by PCP was normal.  Patient was admitted at [facility] was pneumonia in February.  proBNP was 5000.  Echocardiogram obtained on 05/04/2022 demonstrated EF 20 to 25%, grade 1 DD, mild MR.  Due to underlying severe dementia, he was not deemed a good candidate for invasive study.  He was started on Toprol-XL, Farxiga and losartan.  He went to the ED on 07/08/2022 with hypotension.  He was started on midodrine.  Heart rate was bradycardic down to the 40s.  Toprol-XL was held.  He was last seen by Sharlene Dory, NP on 07/20/2022 at which time he was doing well.  Heart rate has improved to 60s, blood pressure 110/60.  Patient apparently was sitting down eating when his eyes rolled back and became unresponsive and grabbed his chest.  This was witnessed by caretaker.  When EMS arrived, heart rate was 28-30.  He was awake and did not require intubation in the ED.  CT of the head showed no acute intracranial abnormality, chronic atrophy and small vessel ischemic changes, partial right mastoid effusion.  Serial troponin negative x 3.  Hemoglobin 14.1.  Normal WBC at 8.5 was normal differentials.  Creatinine 0.88.  Sodium 144, potassium 4.2.  TSH  normal 3.154.  He remained bradycardic with EKG demonstrated Mobitz 2 heart block, he was started on IV dopamine and transferred to [facility] for further evaluation.  He is currently admitted by [facility] service.  During interview, patient was very pleasant and denies any discomfort.  He not remember what happened yesterday due to severe dementia.  His record has been reviewed, the only 2 EKG that was sent over with the patient showed sinus rhythm, T wave inversion in lateral leads, occasional PVCs.  There was no EKG to demonstrate second-degree heart block or advanced conduction disease.  Patient is currently laying in bed comfortable with heart rate in the 50s to 60s.    Past Medical History:  Diagnosis Date   Acquired hypothyroidism    Acute HFrEF (heart failure with reduced ejection fraction) (HCC)    Dementia (HCC)    Dementia (HCC)    Disease of thyroid gland    Hypoxia    Interstitial lung disease (HCC)    Parkinson's disease    Pleural effusion, right    Pneumonia     Past Surgical History:  Procedure Laterality Date   FEMUR FRACTURE SURGERY Right 04/12/2019     Home Medications:  Prior to Admission medications   Medication Sig Start Date End Date Taking? Authorizing Provider  albuterol (VENTOLIN HFA) 108 (90 Base) MCG/ACT inhaler Inhale 2 puffs into the lungs every 6 (six) hours as needed for wheezing or shortness of breath.    [provider]  aspirin 81  MG EC tablet Take 81 mg by mouth daily.    [provider]  carbidopa-levodopa (SINEMET IR) 25-100 MG tablet Take 1 tablet by mouth 3 (three) times daily. 04/07/19   [provider]  dapagliflozin propanediol (FARXIGA) 10 MG TABS tablet Take 1 tablet (10 mg total) by mouth daily. 06/09/22   Antoine Poche, MD  docusate sodium (COLACE) 100 MG capsule Take 100 mg by mouth daily as needed.    [provider]  donepezil (ARICEPT) 5 MG tablet Take 5 mg by mouth at bedtime. 03/31/19    [provider]  finasteride (PROSCAR) 5 MG tablet Take 5 mg by mouth daily.    [provider]  furosemide (LASIX) 20 MG tablet Take 1 tablet (20 mg total) by mouth daily. Alternating with 20 mg 07/06/22   Sharlene Dory, NP  Levothyroxine Sodium 50 MCG CAPS Take 50 mcg by mouth daily. 03/13/19   [provider]  losartan (COZAAR) 25 MG tablet Take 0.5 tablets (12.5 mg total) by mouth daily. 06/09/22   Antoine Poche, MD  memantine (NAMENDA) 10 MG tablet Take 10 mg by mouth 2 (two) times daily. 03/31/19   [provider]  midodrine (PROAMATINE) 2.5 MG tablet TAKE 1 TABLET(2.5 MG) BY MOUTH TWICE DAILY AS NEEDED FOR BLOOD PRESSURE. LESS THAN 90/60. IF NO IMPROVEMENT AFTER TAKING 1 TABLET, GOTO THE ER 07/03/22   Sharlene Dory, NP  Multiple Vitamins-Minerals (PRESERVISION AREDS) TABS Take 1 tablet by mouth 2 (two) times daily.    [provider]  OXYGEN Inhale 2 L into the lungs as needed (sob).    [provider]  potassium chloride (KLOR-CON) 10 MEQ tablet Take 10 mEq by mouth daily.    [provider]    Inpatient Medications: Scheduled Meds:  carbidopa-levodopa  1 tablet Oral TID   donepezil  5 mg Oral QHS   finasteride  5 mg Oral Daily   heparin  5,000 Units Subcutaneous Q8H   insulin aspart  0-5 Units Subcutaneous QHS   insulin aspart  0-9 Units Subcutaneous TID WC   levothyroxine  50 mcg Oral Q0600   memantine  10 mg Oral BID   midodrine  2.5 mg Oral BID WC   Continuous Infusions:  DOPamine 2.225 mcg/kg/min (07/22/22 0700)   PRN Meds: albuterol, docusate sodium, mouth rinse, polyethylene glycol  Allergies:   No Known Allergies  Social History:   Social History   Socioeconomic History   Marital status: Unknown    Spouse name: Not on file   Number of children: Not on file   Years of education: Not on file   Highest education level: Not on file  Occupational History   Not on file  Tobacco Use   Smoking status:  Former    Types: Cigarettes    Passive exposure: Never   Smokeless tobacco: Never  Substance and Sexual Activity   Alcohol use: Never   Drug use: Not on file   Sexual activity: Not on file  Other Topics Concern   Not on file  Social History Narrative   Not on file   Social Determinants of Health   Financial Resource Strain: Not on file  Food Insecurity: Not on file  Transportation Needs: Not on file  Physical Activity: Not on file  Stress: Not on file  Social Connections: Not on file  Intimate Partner Violence: Not on file    Family History:   No family history on file.   ROS:  Please see the history of present illness.   All other ROS reviewed and negative.     Physical Exam/Data:   Vitals:   07/22/22 0530 07/22/22 0600 07/22/22 0630 07/22/22 0700  BP:  124/81 (!) 140/120 124/71  Pulse:  67 69 69  Resp: 17 15 20 16   Temp:    (!) 97.3 F (36.3 C)  TempSrc:    Axillary  SpO2:  97% 96% 97%  Weight:        Intake/Output Summary (Last 24 hours) at 07/22/2022 0821 Last data filed at 07/22/2022 0700 Gross per 24 hour  Intake 3.48 ml  Output 600 ml  Net -596.52 ml      07/22/2022    4:11 AM 07/21/2022   11:45 PM 07/20/2022    1:02 PM  Last 3 Weights  Weight (lbs) 153 lb 3.5 oz 153 lb 3.5 oz 158 lb 12.8 oz  Weight (kg) 69.5 kg 69.5 kg 72.031 kg     Body mass index is 19.67 kg/m.  General:  Well nourished, well developed, in no acute distress HEENT: normal Neck: no JVD Vascular: No carotid bruits; Distal pulses 2+ bilaterally Cardiac:  normal S1, S2; RRR; no murmur  Lungs:  clear to auscultation bilaterally, no wheezing, rhonchi or rales  Abd: soft, nontender, no hepatomegaly  Ext: no edema Musculoskeletal:  No deformities, BUE and BLE strength normal and equal Skin: warm and dry  Neuro:  CNs 2-12 intact, no focal abnormalities noted Psych:  Normal affect   EKG:  The EKG was personally reviewed and demonstrates: Normal sinus rhythm, T wave inversion in  the lateral leads, right bundle branch block, occasional PVCs. Telemetry:  Telemetry was personally reviewed and demonstrates: Sinus rhythm with PVCs, no significant bradycardia or pauses  Relevant CV Studies:  Echo 05/04/2022 Summary   1. The left ventricle is mildly dilated in size with mildly increased wall  thickness.   2. The left ventricular systolic function is normal, LVEF is visually  estimated at 20-25%.    3. There is grade I diastolic dysfunction (impaired relaxation).    4. There is mild mitral valve regurgitation.    5. The left atrium is mildly dilated in size.    6. The right ventricle is normal in size, with normal systolic function.    7. There is mild pulmonary hypertension.    8. IVC size and inspiratory change suggest mildly elevated right atrial  pressure. (5-10 mmHg).    Laboratory Data:  High Sensitivity Troponin:   Recent Labs  Lab 07/08/22 1705  TROPONINIHS 17     Chemistry Recent Labs  Lab 07/22/22 0346  NA 141  K 5.0  CL 105  CO2 26  GLUCOSE 116*  BUN 31*  CREATININE 0.76  CALCIUM 8.7*  MG 2.2  GFRNONAA >60  ANIONGAP 10    No results for input(s): "PROT", "ALBUMIN", "AST", "ALT", "ALKPHOS", "BILITOT" in the last 168 hours. Lipids No results for input(s): "CHOL", "TRIG", "HDL", "LABVLDL", "LDLCALC", "CHOLHDL" in the last 168 hours.  Hematology Recent Labs  Lab 07/22/22 0346  WBC 9.4  RBC 4.28  HGB 13.3  HCT 41.0  MCV 95.8  MCH 31.1  MCHC 32.4  RDW 15.4  PLT 233   Thyroid No results for input(s): "TSH", "FREET4" in the last 168 hours.  BNP Recent Labs  Lab 07/22/22 0346  BNP 428.5*    DDimer No results for input(s): "DDIMER" in the last 168 hours.   Radiology/Studies:  No results found.  Assessment and Plan:   Unresponsiveness  -Reportedly had a heart rate in the high 20s to 30 bpm on EMS arrival.  No EMS strip was available.  Currently on dopamine drip, will attempt to wean off dopamine today to see heart rate and  the blood pressure response.  He is not a great candidate for any invasive study or pacemaker.  Reported bradycardia: Treated recent ED visit on 5/1, he had hypotension and bradycardia, Toprol-XL was discontinued at that time.  He reportedly passed out at home yesterday with heart rate in the high 20s to 30 bpm.  No strip is available.  EKG that was sent over with the patient showed sinus rhythm with PVCs, heart rate in the 50 to 60s.  HFrEF: EF 20 to 25% in February 2024.  Euvolemic on exam.  Toprol-XL discontinued on 5/1 due to bradycardia and hypotension.  He was started on midodrine.  He was also on half a tablet of 25 mg losartan at home, this is being held for now due to syncope yesterday.  He is also on [place] at home, this is also held.  Interstitial lung disease: Not oxygen dependent  Severe dementia: Patient does not have any memory of what happened yesterday.  Hypotension: On midodrine   Risk Assessment/Risk Scores:        New York Heart Association (NYHA) Functional Class NYHA Class II        For questions or updates, please contact [facility] Please consult [email or web address] for contact info under    Signed, Azalee Course, PA  07/22/2022 8:21 AM

## 2022-07-22 NOTE — Care Management (Signed)
  Transition of Care [facility]) Screening Note   Patient Details  Name: Seth Stout Date of Birth: [birth year]   Transition of Care [facility]) CM/SW Contact:    Gala Lewandowsky, RN Phone Number: 07/22/2022, 2:33 PM    Transition of Care Department [facility]) has reviewed the patient and no TOC needs have been identified at this time. Per notes patient presented for syncope related to orthostatic hypotension. We will continue to monitor patient advancement through interdisciplinary progression rounds. If new patient transition needs arise, please place a TOC consult.

## 2022-07-22 NOTE — Care Management Obs Status (Signed)
MEDICARE OBSERVATION STATUS NOTIFICATION   Patient Details  Name: Lj Sliman MRN: [id number] Date of Birth: 05-17-1933   Medicare Observation Status Notification Given:  Waylan Boga, RN 07/22/2022, 7:24 PM

## 2022-07-22 NOTE — H&P (Signed)
NAME:  Seth Stout, MRN:  [id number], DOB:  15-Jul-1933, LOS: 1 ADMISSION DATE:  07/21/2022, CONSULTATION DATE:  07/22/22 REFERRING MD:  EDP, CHIEF COMPLAINT:  bradycardia   History of Present Illness:  Seth Stout is a 89 y.o. M with PMH significant for dementia, parkinson's disease, HFrEF and hypothyroidism who presented to [facility] after an episode of  unresponsiveness at home witnessed by caregiver.  History taken from Epic as patient is unable to provide it with hx of dementia and no family at the bedside.  He was sitting down eating when his eyes rolled back and became unresponsive and grabbed his chest.  When EMS arrived his HR was 28-30.  He was awake and did not require intubation in the ED, however remained bradycardic and was started on Dopamine, EKG with Mobitz II HB.  Labs were fairly unremarkable without an elevated troponin or electrolyte abnormalities.  He was transported to [facility] for further evaluation.  On arrival, he is on minimal dopamine and is alert and stable.   Pertinent  Medical History   has a past medical history of Acquired hypothyroidism, Acute HFrEF (heart failure with reduced ejection fraction) (HCC), Dementia (HCC), Dementia (HCC), Disease of thyroid gland, Hypoxia, Interstitial lung disease (HCC), Parkinson's disease, Pleural effusion, right, and Pneumonia.   Significant Hospital Events: Including procedures, antibiotic start and stop dates in addition to other pertinent events   5/15 transfer to [facility] from [facility] for bradycardia on dopamine  Interim History / Subjective:  Pt arrived on Dopamine , calm and alert with HR in 60's   Objective   Blood pressure 108/71, pulse 77, temperature (!) 97.5 F (36.4 C), temperature source Oral, resp. rate 17, weight 69.5 kg, SpO2 96 %.        Intake/Output Summary (Last 24 hours) at 07/22/2022 0015 Last data filed at 07/22/2022 0000 Gross per 24 hour  Intake 0 ml  Output 0 ml  Net 0 ml   Filed Weights    07/21/22 2345  Weight: 69.5 kg    General:  thin, elderly M, awake and in no distress HEENT: MM pink/moist, sclera anicteric Neuro: alert, calm and pleasant and following commands, though oriented to person only CV: s1s2 bradycardic, no m/r/g PULM:  clear bilaterally on 2L [place] without distress GI: soft, non-tender  Extremities: warm/dry, no edema  Skin: no rashes or lesions   Resolved Hospital Problem list     Assessment & Plan:   Bradycardia Mobitz 2 heart block History of HFrEF Last saw cardiology 5/13, he had been doing well and was recently started on Midodrine and had noted recent HR in 40's, BB had been on hold, last EF 20-25%  -continue dopamine and telemetry monitoring, blood pressure is stable and HR 60's -cardiology to see -echo -repeat labs -repeat EKG -hold home Losartan and Lasix   History of Dementia Recent concern for dysphagia -speech consult  -continue Namenda, Sinemet and Aricept   Hypothyroidism -TSH WNL -continue Synthroid  Best Practice (right click and "Reselect all SmartList Selections" daily)   Diet/type: NPO DVT prophylaxis: prophylactic heparin  GI prophylaxis: N/A Lines: N/A Foley:  N/A Code Status:  DNR Last date of multidisciplinary goals of care discussion [pending, DNR documented in paperwork with patient]  Labs   CBC: No results for input(s): "WBC", "NEUTROABS", "HGB", "HCT", "MCV", "PLT" in the last 168 hours.  Basic Metabolic Panel: No results for input(s): "NA", "K", "CL", "CO2", "GLUCOSE", "BUN", "CREATININE", "CALCIUM", "MG", "PHOS" in the last 168 hours.  GFR: Estimated Creatinine Clearance: 59.2 mL/min (by C-G formula based on SCr of 0.88 mg/dL). No results for input(s): "PROCALCITON", "WBC", "LATICACIDVEN" in the last 168 hours.  Liver Function Tests: No results for input(s): "AST", "ALT", "ALKPHOS", "BILITOT", "PROT", "ALBUMIN" in the last 168 hours. No results for input(s): "LIPASE", "AMYLASE" in the last 168  hours. No results for input(s): "AMMONIA" in the last 168 hours.  ABG No results found for: "PHART", "PCO2ART", "PO2ART", "HCO3", "TCO2", "ACIDBASEDEF", "O2SAT"   Coagulation Profile: No results for input(s): "INR", "PROTIME" in the last 168 hours.  Cardiac Enzymes: No results for input(s): "CKTOTAL", "CKMB", "CKMBINDEX", "TROPONINI" in the last 168 hours.  HbA1C: No results found for: "HGBA1C"  CBG: No results for input(s): "GLUCAP" in the last 168 hours.  Review of Systems:   Unable to obtain secondary to dementia  Past Medical History:  He,  has a past medical history of Acquired hypothyroidism, Acute HFrEF (heart failure with reduced ejection fraction) (HCC), Dementia (HCC), Dementia (HCC), Disease of thyroid gland, Hypoxia, Interstitial lung disease (HCC), Parkinson's disease, Pleural effusion, right, and Pneumonia.   Surgical History:   Past Surgical History:  Procedure Laterality Date   FEMUR FRACTURE SURGERY Right 04/12/2019     Social History:   reports that he has quit smoking. His smoking use included cigarettes. He has never been exposed to tobacco smoke. He has never used smokeless tobacco. He reports that he does not drink alcohol.   Family History:  His family history is not on file.   Allergies No Known Allergies   Home Medications  Prior to Admission medications   Medication Sig Start Date End Date Taking? Authorizing Provider  albuterol (VENTOLIN HFA) 108 (90 Base) MCG/ACT inhaler Inhale 2 puffs into the lungs every 6 (six) hours as needed for wheezing or shortness of breath.    [provider]  aspirin 81 MG EC tablet Take 81 mg by mouth daily.    [provider]  carbidopa-levodopa (SINEMET IR) 25-100 MG tablet Take 1 tablet by mouth 3 (three) times daily. 04/07/19   [provider]  dapagliflozin propanediol (FARXIGA) 10 MG TABS tablet Take 1 tablet (10 mg total) by mouth daily. 06/09/22   Antoine Poche, MD  docusate  sodium (COLACE) 100 MG capsule Take 100 mg by mouth daily as needed.    [provider]  donepezil (ARICEPT) 5 MG tablet Take 5 mg by mouth at bedtime. 03/31/19   [provider]  finasteride (PROSCAR) 5 MG tablet Take 5 mg by mouth daily.    [provider]  furosemide (LASIX) 20 MG tablet Take 1 tablet (20 mg total) by mouth daily. Alternating with 20 mg 07/06/22   Sharlene Dory, NP  Levothyroxine Sodium 50 MCG CAPS Take 50 mcg by mouth daily. 03/13/19   [provider]  losartan (COZAAR) 25 MG tablet Take 0.5 tablets (12.5 mg total) by mouth daily. 06/09/22   Antoine Poche, MD  memantine (NAMENDA) 10 MG tablet Take 10 mg by mouth 2 (two) times daily. 03/31/19   [provider]  midodrine (PROAMATINE) 2.5 MG tablet TAKE 1 TABLET(2.5 MG) BY MOUTH TWICE DAILY AS NEEDED FOR BLOOD PRESSURE. LESS THAN 90/60. IF NO IMPROVEMENT AFTER TAKING 1 TABLET, GOTO THE ER 07/03/22   Sharlene Dory, NP  Multiple Vitamins-Minerals (PRESERVISION AREDS) TABS Take 1 tablet by mouth 2 (two) times daily.    [provider]  OXYGEN Inhale 2 L into the lungs as needed (sob).  [provider]  potassium chloride (KLOR-CON) 10 MEQ tablet Take 10 mEq by mouth daily.    [provider]     Critical care time:  42 minutes    CRITICAL CARE Performed by: Darcella Gasman Royce Stegman   Total critical care time: 42 minutes  Critical care time was exclusive of separately billable procedures and treating other patients.  Critical care was necessary to treat or prevent imminent or life-threatening deterioration.  Critical care was time spent personally by me on the following activities: development of treatment plan with patient and/or surrogate as well as nursing, discussions with consultants, evaluation of patient's response to treatment, examination of patient, obtaining history from patient or surrogate, ordering and performing treatments and interventions,  ordering and review of laboratory studies, ordering and review of radiographic studies, pulse oximetry and re-evaluation of patient's condition.   Darcella Gasman Zarian Colpitts, PA-C [facility] See Amion for pager If no response to pager , please call [phone] until 7pm After 7:00 pm call Elink  [phone]

## 2022-07-22 NOTE — Discharge Summary (Signed)
Physician Discharge Summary  Patient ID: Seth Stout MRN: [id number] DOB/AGE: 87-Aug-1935 87 y.o.  Admit date: 07/21/2022 Discharge date: 07/22/2022  Admission Diagnoses:  Discharge Diagnoses:  Principal Problem:   Bradycardia   Discharged Condition: fair  Hospital Course: Seth Stout is a 89 year old male with past medical history of severe dementia, interstitial lung disease, Parkinson's disease, hypotension on midodrine and recently diagnosed HFrEF.  Previous echocardiogram obtained a year ago by PCP was normal.  Patient was admitted at [facility] was pneumonia in February.  proBNP was 5000.  Echocardiogram obtained on 05/04/2022 demonstrated EF 20 to 25%, grade 1 DD, mild MR.  Due to underlying severe dementia, he was not deemed a good candidate for invasive study.  He was started on Toprol-XL, Farxiga and losartan.  He went to the ED on 07/08/2022 with hypotension.  He was started on midodrine.  Heart rate was bradycardic down to the 40s.  Toprol-XL was held.   Patient apparently was sitting down eating when his eyes rolled back and became unresponsive and grabbed his chest. This was witnessed by caretaker. When EMS arrived, heart rate was 28-30. He was awake and did not require intubation in the ED. CT of the head showed no acute intracranial abnormality, chronic atrophy and small vessel ischemic changes, partial right mastoid effusion. Serial troponin negative x 3. Hemoglobin 14.1. Normal WBC at 8.5 was normal differentials. Creatinine 0.88. Sodium 144, potassium 4.2. TSH normal 3.154. He remained bradycardic with EKG demonstrated Mobitz 2 heart block, he was started on IV dopamine and transferred to [facility] for further evaluation.   Home medications were held and he was weaned off dopamine and BP and HR remained robust. Syncopal episode felt to be related to orthostatic hypotension/vagal episode.   Seen by cardiology who felt that given advanced age and severe dementia he is not  a candidate for invasive procedures or pacemaker.  Indeed, despite severe heart failure, the patient's Parkinson's disease and dementia, with their associated autonomic instability, makes tolerating GDMT difficult. He has shown himself intolerant of both beta-blocker and ARB. This leaves diuretics and SGLT-2 inhibitors. I spoke with the family and we have agreed that a predominantly comfort/symptom management more would be best.   He was ambulated around his room and appears to be at baseline. Given that further hospitalization comes with the risk of further functional decline, we have decided to discharge him home. The family feels comfortable with this plan, and the patient has a 24-h/day in-home caregiver.   Consults: cardiology   Discharge Exam: Blood pressure 125/75, pulse 60, temperature 98.1 F (36.7 C), temperature source Oral, resp. rate 18, weight 69.5 kg, SpO2 (!) 87 %. General appearance: alert, cooperative, and cachectic Head: Normocephalic, without obvious abnormality, atraumatic Resp: clear to auscultation bilaterally Cardio: regular rate and rhythm, S1, S2 normal, no murmur, click, rub or gallop Skin: Skin color, texture, turgor normal. No rashes or lesions Neurologic: Grossly normal  Disposition: Discharge disposition: 01-Home or Self Care       Allergies as of 07/22/2022   No Known Allergies      Medication List     STOP taking these medications    losartan 25 MG tablet Commonly known as: COZAAR       TAKE these medications    albuterol 108 (90 Base) MCG/ACT inhaler Commonly known as: VENTOLIN HFA Inhale 2 puffs into the lungs every 6 (six) hours as needed for wheezing or shortness of breath.   aspirin EC 81 MG tablet  Take 81 mg by mouth daily.   carbidopa-levodopa 25-100 MG tablet Commonly known as: SINEMET IR Take 1 tablet by mouth 3 (three) times daily.   dapagliflozin propanediol 10 MG Tabs tablet Commonly known as: Farxiga Take 1 tablet (10  mg total) by mouth daily.   docusate sodium 100 MG capsule Commonly known as: COLACE Take 100 mg by mouth daily as needed.   donepezil 5 MG tablet Commonly known as: ARICEPT Take 5 mg by mouth at bedtime.   finasteride 5 MG tablet Commonly known as: PROSCAR Take 5 mg by mouth daily.   furosemide 20 MG tablet Commonly known as: LASIX Take 1 tablet (20 mg total) by mouth daily. Alternating with 20 mg   Levothyroxine Sodium 50 MCG Caps Take 50 mcg by mouth daily.   memantine 10 MG tablet Commonly known as: NAMENDA Take 10 mg by mouth 2 (two) times daily.   midodrine 2.5 MG tablet Commonly known as: PROAMATINE TAKE 1 TABLET(2.5 MG) BY MOUTH TWICE DAILY AS NEEDED FOR BLOOD PRESSURE. LESS THAN 90/60. IF NO IMPROVEMENT AFTER TAKING 1 TABLET, GOTO THE ER   OXYGEN Inhale 2 L into the lungs as needed (sob).   potassium chloride 10 MEQ tablet Commonly known as: KLOR-CON Take 10 mEq by mouth daily.   PreserVision AREDS Tabs Take 1 tablet by mouth 2 (two) times daily.       50 min were spent on the day of discharge.   SignedLynnell Catalan 07/22/2022, 5:58 PM

## 2022-07-22 NOTE — Progress Notes (Signed)
Echocardiogram 2D Echocardiogram has been performed.  Seth Stout 07/22/2022, 9:16 AM

## 2022-07-22 NOTE — Care Management CC44 (Signed)
Condition Code 44 Documentation Completed  Patient Details  Name: Krishang Lohrmann MRN: [id number] Date of Birth: 04/26/1933   Condition Code 44 given:  Yes Patient signature on Condition Code 44 notice:  Yes Documentation of 2 MD's agreement:    Code 44 added to claim:  Yes    Michel Bickers, RN 07/22/2022, 7:24 PM

## 2022-07-22 NOTE — TOC Initial Note (Signed)
Transition of Care [facility]) - Initial/Assessment Note    Patient Details  Name: Seth Stout MRN: [id number] Date of Birth: 05-Apr-1933  [facility] CM/SW Contact:    Elliot Cousin, RN Phone Number: [phone] 07/22/2022, 5:30 PM  Clinical Narrative:    CM spoke to pt at bedside. States to call and speak to niece. Contacted niece and left message. Pt has 24 hour caregivers. Niece lives close to patient. Pt has RW at home.              Expected Discharge Plan: Home/Self Care Barriers to Discharge: No Barriers Identified   Patient Goals and CMS Choice     Expected Discharge Plan and Services   Discharge Planning Services: CM Consult     Prior Living Arrangements/Services   Lives with:: Self Patient language and need for interpreter reviewed:: Yes Do you feel safe going back to the place where you live?: Yes      Need for Family Participation in Patient Care: No (Comment) Care giver support system in place?: Yes (comment) Current home services: Homehealth aide (24 hour caregivers, RW) Criminal Activity/Legal Involvement Pertinent to Current Situation/Hospitalization: No - Comment as needed  Activities of Daily Living      Permission Sought/Granted Permission sought to share information with : Case Manager, Family Supports Permission granted to share information with : Yes, Verbal Permission Granted  Share Information with NAME: Elsworth Soho     Permission granted to share info w Relationship: niece  Permission granted to share info w Contact Information: [phone]  Emotional Assessment Appearance:: Appears stated age Attitude/Demeanor/Rapport: Engaged Affect (typically observed): Accepting Orientation: : Oriented to Self, Oriented to Place, Oriented to  Time, Oriented to Situation   Psych Involvement: No (comment)  Admission diagnosis:  Bradycardia [R00.1] Patient Active Problem List   Diagnosis Date Noted   Bradycardia 07/22/2022   Closed  hip fracture requiring operative repair with routine healing 04/21/2019   Elevated prostate specific antigen (PSA) 04/21/2019   Inguinal hernia 04/21/2019   Tobacco use disorder 04/21/2019   PCP:  Ignatius Specking, MD Pharmacy:   [facility] [phone] - [place], [place] - [address] AT [facility] [address] [place] [address] Phone: [phone] Fax: [phone]     Social Determinants of Health (SDOH) Social History: SDOH Screenings   Tobacco Use: Medium Risk (07/20/2022)   SDOH Interventions:     Readmission Risk Interventions     No data to display

## 2022-07-22 NOTE — Progress Notes (Signed)
eLink Physician-Brief Progress Note Patient Name: Seth Stout DOB: Jan 26, 1934 MRN: [id number]   Date of Service  07/22/2022  HPI/Events of Note  Patient with advanced HFrEF (EF 20 - 25 %) admitted with hypotension after recent adjustments to his medication regimen, hypotension improved with the addition of Midodrine. Cardiology is following.  eICU Interventions  New Patient Evaluation.        Thomasene Lot Emri Sample 07/22/2022, 12:04 AM

## 2022-07-27 DIAGNOSIS — Z299 Encounter for prophylactic measures, unspecified: Secondary | ICD-10-CM | POA: Diagnosis not present

## 2022-07-27 DIAGNOSIS — I5022 Chronic systolic (congestive) heart failure: Secondary | ICD-10-CM | POA: Diagnosis not present

## 2022-07-27 DIAGNOSIS — I441 Atrioventricular block, second degree: Secondary | ICD-10-CM | POA: Diagnosis not present

## 2022-07-27 DIAGNOSIS — E440 Moderate protein-calorie malnutrition: Secondary | ICD-10-CM | POA: Diagnosis not present

## 2022-07-27 DIAGNOSIS — I10 Essential (primary) hypertension: Secondary | ICD-10-CM | POA: Diagnosis not present

## 2022-07-29 DIAGNOSIS — I509 Heart failure, unspecified: Secondary | ICD-10-CM | POA: Diagnosis not present

## 2022-07-29 DIAGNOSIS — Z515 Encounter for palliative care: Secondary | ICD-10-CM | POA: Diagnosis not present

## 2022-07-29 DIAGNOSIS — R0600 Dyspnea, unspecified: Secondary | ICD-10-CM | POA: Diagnosis not present

## 2022-07-29 DIAGNOSIS — R609 Edema, unspecified: Secondary | ICD-10-CM | POA: Diagnosis not present

## 2022-08-04 DIAGNOSIS — J441 Chronic obstructive pulmonary disease with (acute) exacerbation: Secondary | ICD-10-CM | POA: Diagnosis not present

## 2022-08-04 DIAGNOSIS — I509 Heart failure, unspecified: Secondary | ICD-10-CM | POA: Diagnosis not present

## 2022-08-04 DIAGNOSIS — J189 Pneumonia, unspecified organism: Secondary | ICD-10-CM | POA: Diagnosis not present

## 2022-08-13 ENCOUNTER — Ambulatory Visit: Payer: Medicare PPO | Admitting: Nurse Practitioner

## 2022-08-14 DIAGNOSIS — B351 Tinea unguium: Secondary | ICD-10-CM | POA: Diagnosis not present

## 2022-08-14 DIAGNOSIS — M79676 Pain in unspecified toe(s): Secondary | ICD-10-CM | POA: Diagnosis not present

## 2022-08-31 ENCOUNTER — Telehealth: Payer: Self-pay | Admitting: Nurse Practitioner

## 2022-08-31 ENCOUNTER — Telehealth: Payer: Medicare PPO | Admitting: Nurse Practitioner

## 2023-03-06 DIAGNOSIS — I509 Heart failure, unspecified: Secondary | ICD-10-CM | POA: Diagnosis not present

## 2023-03-06 DIAGNOSIS — J441 Chronic obstructive pulmonary disease with (acute) exacerbation: Secondary | ICD-10-CM | POA: Diagnosis not present

## 2023-03-06 DIAGNOSIS — J189 Pneumonia, unspecified organism: Secondary | ICD-10-CM | POA: Diagnosis not present

## 2023-08-08 DEATH — deceased
